# Patient Record
Sex: Female | Born: 1980
Health system: Southern US, Community
[De-identification: ages and names within clinical notes are randomized; demographics above are authoritative.]

## PROBLEM LIST (undated history)

## (undated) DIAGNOSIS — I10 Essential (primary) hypertension: Secondary | ICD-10-CM

## (undated) DIAGNOSIS — W3400XA Accidental discharge from unspecified firearms or gun, initial encounter: Secondary | ICD-10-CM

## (undated) DIAGNOSIS — L732 Hidradenitis suppurativa: Secondary | ICD-10-CM

## (undated) DIAGNOSIS — L0292 Furuncle, unspecified: Secondary | ICD-10-CM

## (undated) DIAGNOSIS — B009 Herpesviral infection, unspecified: Secondary | ICD-10-CM

## (undated) DIAGNOSIS — A64 Unspecified sexually transmitted disease: Secondary | ICD-10-CM

## (undated) HISTORY — DX: Essential (primary) hypertension: I10

## (undated) HISTORY — DX: Herpesviral infection, unspecified: B00.9

## (undated) HISTORY — DX: Furuncle, unspecified: L02.92

## (undated) HISTORY — DX: Unspecified sexually transmitted disease: A64

---
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
## 2002-03-25 ENCOUNTER — Emergency Department (HOSPITAL_COMMUNITY): Admission: EM | Admit: 2002-03-25 | Discharge: 2002-03-25 | Payer: Self-pay | Admitting: *Deleted

## 2003-07-15 ENCOUNTER — Emergency Department (HOSPITAL_COMMUNITY): Admission: EM | Admit: 2003-07-15 | Discharge: 2003-07-15 | Payer: Self-pay | Admitting: Emergency Medicine

## 2004-08-14 ENCOUNTER — Emergency Department (HOSPITAL_COMMUNITY): Admission: EM | Admit: 2004-08-14 | Discharge: 2004-08-14 | Payer: Self-pay | Admitting: Emergency Medicine

## 2004-12-05 ENCOUNTER — Emergency Department (HOSPITAL_COMMUNITY): Admission: EM | Admit: 2004-12-05 | Discharge: 2004-12-05 | Payer: Self-pay | Admitting: Emergency Medicine

## 2006-01-09 ENCOUNTER — Emergency Department (HOSPITAL_COMMUNITY): Admission: EM | Admit: 2006-01-09 | Discharge: 2006-01-09 | Payer: Self-pay | Admitting: Emergency Medicine

## 2007-02-09 ENCOUNTER — Emergency Department (HOSPITAL_COMMUNITY): Admission: EM | Admit: 2007-02-09 | Discharge: 2007-02-09 | Payer: Self-pay | Admitting: Emergency Medicine

## 2009-06-21 ENCOUNTER — Emergency Department (HOSPITAL_COMMUNITY): Admission: EM | Admit: 2009-06-21 | Discharge: 2009-06-21 | Payer: Self-pay | Admitting: Emergency Medicine

## 2010-01-03 ENCOUNTER — Emergency Department (HOSPITAL_COMMUNITY): Admission: EM | Admit: 2010-01-03 | Discharge: 2010-01-03 | Payer: Self-pay | Admitting: Emergency Medicine

## 2010-02-26 ENCOUNTER — Emergency Department (HOSPITAL_COMMUNITY): Admission: EM | Admit: 2010-02-26 | Discharge: 2010-02-26 | Payer: Self-pay | Admitting: Emergency Medicine

## 2010-05-22 ENCOUNTER — Emergency Department (HOSPITAL_COMMUNITY): Admission: EM | Admit: 2010-05-22 | Discharge: 2010-05-22 | Payer: Self-pay | Admitting: Emergency Medicine

## 2010-09-19 ENCOUNTER — Emergency Department (HOSPITAL_COMMUNITY)
Admission: EM | Admit: 2010-09-19 | Discharge: 2010-09-19 | Discharge status: Against Medical Advice | Payer: Self-pay | Attending: Emergency Medicine | Admitting: Emergency Medicine

## 2010-09-19 DIAGNOSIS — N898 Other specified noninflammatory disorders of vagina: Secondary | ICD-10-CM | POA: Insufficient documentation

## 2010-09-19 LAB — POCT I-STAT, CHEM 8
BUN: 5 mg/dL — ABNORMAL LOW (ref 6–23)
Calcium, Ion: 1.19 mmol/L (ref 1.12–1.32)
Chloride: 106 meq/L (ref 96–112)
Creatinine, Ser: 0.9 mg/dL (ref 0.4–1.2)
Glucose, Bld: 74 mg/dL (ref 70–99)
HCT: 44 % (ref 36.0–46.0)
Hemoglobin: 15 g/dL (ref 12.0–15.0)
Potassium: 4.3 meq/L (ref 3.5–5.1)
Sodium: 140 meq/L (ref 135–145)
TCO2: 23 mmol/L (ref 0–100)

## 2010-09-19 LAB — URINE MICROSCOPIC-ADD ON

## 2010-09-19 LAB — URINALYSIS, ROUTINE W REFLEX MICROSCOPIC
Bilirubin Urine: NEGATIVE
Ketones, ur: NEGATIVE mg/dL
Nitrite: NEGATIVE
Protein, ur: NEGATIVE mg/dL
Specific Gravity, Urine: 1.027 (ref 1.005–1.030)
Urine Glucose, Fasting: NEGATIVE mg/dL
Urobilinogen, UA: 1 mg/dL (ref 0.0–1.0)
pH: 6 (ref 5.0–8.0)

## 2010-09-19 LAB — POCT PREGNANCY, URINE: Preg Test, Ur: NEGATIVE

## 2010-10-16 LAB — URINALYSIS, ROUTINE W REFLEX MICROSCOPIC
Bilirubin Urine: NEGATIVE
Glucose, UA: NEGATIVE mg/dL
Hgb urine dipstick: NEGATIVE
Ketones, ur: NEGATIVE mg/dL
Nitrite: NEGATIVE
Protein, ur: NEGATIVE mg/dL
Specific Gravity, Urine: 1.024 (ref 1.005–1.030)
Urobilinogen, UA: 0.2 mg/dL (ref 0.0–1.0)
pH: 6 (ref 5.0–8.0)

## 2010-10-16 LAB — GC/CHLAMYDIA PROBE AMP, GENITAL
Chlamydia, DNA Probe: NEGATIVE
GC Probe Amp, Genital: NEGATIVE

## 2010-10-16 LAB — POCT PREGNANCY, URINE: Preg Test, Ur: NEGATIVE

## 2010-10-16 LAB — WET PREP, GENITAL
Trich, Wet Prep: NONE SEEN
Yeast Wet Prep HPF POC: NONE SEEN

## 2010-10-21 LAB — BASIC METABOLIC PANEL
BUN: 10 mg/dL (ref 6–23)
Chloride: 107 mEq/L (ref 96–112)
Glucose, Bld: 83 mg/dL (ref 70–99)
Potassium: 3.9 mEq/L (ref 3.5–5.1)

## 2010-10-21 LAB — URINALYSIS, ROUTINE W REFLEX MICROSCOPIC
Glucose, UA: NEGATIVE mg/dL
Hgb urine dipstick: NEGATIVE
Specific Gravity, Urine: 1.028 (ref 1.005–1.030)
pH: 6 (ref 5.0–8.0)

## 2010-10-21 LAB — DIFFERENTIAL
Eosinophils Absolute: 0.2 10*3/uL (ref 0.0–0.7)
Eosinophils Relative: 2 % (ref 0–5)
Lymphs Abs: 2.3 10*3/uL (ref 0.7–4.0)
Monocytes Absolute: 0.4 10*3/uL (ref 0.1–1.0)
Monocytes Relative: 6 % (ref 3–12)

## 2010-10-21 LAB — WET PREP, GENITAL
Trich, Wet Prep: NONE SEEN
Yeast Wet Prep HPF POC: NONE SEEN

## 2010-10-21 LAB — GC/CHLAMYDIA PROBE AMP, GENITAL: GC Probe Amp, Genital: NEGATIVE

## 2010-10-21 LAB — POCT PREGNANCY, URINE: Preg Test, Ur: NEGATIVE

## 2010-10-21 LAB — CBC
HCT: 40.5 % (ref 36.0–46.0)
MCV: 87.2 fL (ref 78.0–100.0)
Platelets: 179 10*3/uL (ref 150–400)
WBC: 7.4 10*3/uL (ref 4.0–10.5)

## 2010-11-06 LAB — URINE CULTURE: Colony Count: 25000

## 2010-11-06 LAB — URINALYSIS, ROUTINE W REFLEX MICROSCOPIC
Bilirubin Urine: NEGATIVE
Hgb urine dipstick: NEGATIVE
Ketones, ur: NEGATIVE mg/dL
Nitrite: NEGATIVE
Urobilinogen, UA: 1 mg/dL (ref 0.0–1.0)

## 2010-11-06 LAB — WET PREP, GENITAL
Trich, Wet Prep: NONE SEEN
Yeast Wet Prep HPF POC: NONE SEEN

## 2010-11-06 LAB — URINE MICROSCOPIC-ADD ON

## 2010-11-06 LAB — GC/CHLAMYDIA PROBE AMP, GENITAL
Chlamydia, DNA Probe: NEGATIVE
GC Probe Amp, Genital: NEGATIVE

## 2010-11-06 LAB — POCT PREGNANCY, URINE: Preg Test, Ur: NEGATIVE

## 2011-12-26 ENCOUNTER — Emergency Department (HOSPITAL_COMMUNITY)
Admission: EM | Admit: 2011-12-26 | Discharge: 2011-12-26 | Disposition: A | Discharge status: Home / Self Care | Payer: Self-pay | Attending: Emergency Medicine | Admitting: Emergency Medicine

## 2011-12-26 ENCOUNTER — Encounter (HOSPITAL_COMMUNITY): Payer: Self-pay | Admitting: *Deleted

## 2011-12-26 DIAGNOSIS — K029 Dental caries, unspecified: Secondary | ICD-10-CM | POA: Insufficient documentation

## 2011-12-26 DIAGNOSIS — K0889 Other specified disorders of teeth and supporting structures: Secondary | ICD-10-CM

## 2011-12-26 MED ORDER — HYDROCODONE-ACETAMINOPHEN 5-325 MG PO TABS: 1.0000 | ORAL_TABLET | Freq: Four times a day (QID) | ORAL | Status: AC | PRN

## 2011-12-26 MED ORDER — PENICILLIN V POTASSIUM 500 MG PO TABS: 500.0000 mg | ORAL_TABLET | Freq: Four times a day (QID) | ORAL | Status: AC

## 2011-12-26 MED ORDER — IBUPROFEN 800 MG PO TABS: 800.0000 mg | ORAL_TABLET | Freq: Three times a day (TID) | ORAL | Status: AC | PRN

## 2011-12-26 NOTE — ED Notes (Signed)
Pt c/o right lower back tooth pain x 1 week.

## 2011-12-26 NOTE — Discharge Instructions (Signed)
Return here as needed.  Followup with the oral surgeon provided.  Rinse with warm water and peroxide mixed, 3 times a day

## 2011-12-26 NOTE — ED Provider Notes (Signed)
History     CSN: [id number]  Arrival date & time 12/26/11  1342   First MD Initiated Contact with Patient 12/26/11 1356      Chief Complaint  Patient presents with  . Dental Pain    (Consider location/radiation/quality/duration/timing/severity/associated sxs/prior treatment) HPI She presents emergency Dept. with dental pain over wisdom tooth on the right lower.  He states the tooth cracked couple days ago.  Patient denies difficulty breathing, swallowing, fever, or neck swelling.  Patient states she cut ibuprofen for her pain and did not seem to help.      No past medical history on file.  No past surgical history on file.  No family history on file.  History  Substance Use Topics  . Smoking status: Not on file  . Smokeless tobacco: Not on file  . Alcohol Use: Not on file    OB History    No data available      Review of Systems All other systems negative except as documented in the HPI. All pertinent positives and negatives as reviewed in the HPI.  Allergies  Review of patient's allergies indicates no known allergies.  Home Medications   Current Outpatient Rx  Name Route Sig Dispense Refill  . BC HEADACHE PO Oral Take 1 packet by mouth every 6 (six) hours as needed. For pain    . IBUPROFEN 200 MG PO TABS Oral Take 400 mg by mouth every 6 (six) hours as needed. For pain      There were no vitals taken for this visit.  Physical Exam  Constitutional: She appears well-developed and well-nourished. No distress.  HENT:  Head: No trismus in the jaw.  Mouth/Throat: Oropharynx is clear and moist and mucous membranes are normal. Dental caries present. No uvula swelling.      ED Course  Procedures (including critical care time)  Patient referred to oral surgery and given antibiotics and pain control told to rinse with warm water and peroxide 3 times a day.  Told to return here for any worsening in her condition.  Patient has no signs of Ludwig angina or  swelling of the throat.  MDM          Carlyle Dolly, PA-C 12/26/11 1400

## 2011-12-26 NOTE — ED Provider Notes (Signed)
Medical screening examination/treatment/procedure(s) were performed by non-physician practitioner and as supervising physician I was immediately available for consultation/collaboration.   Lyanne Co, MD 12/26/11 [phone]

## 2012-08-04 DIAGNOSIS — W3400XA Accidental discharge from unspecified firearms or gun, initial encounter: Secondary | ICD-10-CM

## 2012-08-04 HISTORY — DX: Accidental discharge from unspecified firearms or gun, initial encounter: W34.00XA

## 2013-07-27 ENCOUNTER — Emergency Department (HOSPITAL_COMMUNITY): Payer: No Typology Code available for payment source

## 2013-07-27 ENCOUNTER — Inpatient Hospital Stay (HOSPITAL_COMMUNITY)
Admission: EM | Admit: 2013-07-27 | Discharge: 2013-08-05 | DRG: 329 | Disposition: A | Discharge status: Home Health | Payer: No Typology Code available for payment source | Attending: Surgery | Admitting: Surgery

## 2013-07-27 ENCOUNTER — Encounter (HOSPITAL_COMMUNITY): Payer: No Typology Code available for payment source | Admitting: Anesthesiology

## 2013-07-27 ENCOUNTER — Emergency Department (HOSPITAL_COMMUNITY): Payer: No Typology Code available for payment source | Admitting: Anesthesiology

## 2013-07-27 DIAGNOSIS — K661 Hemoperitoneum: Secondary | ICD-10-CM

## 2013-07-27 DIAGNOSIS — S36409A Unspecified injury of unspecified part of small intestine, initial encounter: Secondary | ICD-10-CM

## 2013-07-27 DIAGNOSIS — D62 Acute posthemorrhagic anemia: Secondary | ICD-10-CM | POA: Diagnosis not present

## 2013-07-27 DIAGNOSIS — W3400XA Accidental discharge from unspecified firearms or gun, initial encounter: Secondary | ICD-10-CM

## 2013-07-27 DIAGNOSIS — S36509A Unspecified injury of unspecified part of colon, initial encounter: Secondary | ICD-10-CM

## 2013-07-27 DIAGNOSIS — I10 Essential (primary) hypertension: Secondary | ICD-10-CM | POA: Diagnosis present

## 2013-07-27 DIAGNOSIS — E876 Hypokalemia: Secondary | ICD-10-CM | POA: Diagnosis present

## 2013-07-27 DIAGNOSIS — S36503A Unspecified injury of sigmoid colon, initial encounter: Secondary | ICD-10-CM | POA: Diagnosis present

## 2013-07-27 DIAGNOSIS — S36499A Other injury of unspecified part of small intestine, initial encounter: Principal | ICD-10-CM | POA: Diagnosis present

## 2013-07-27 DIAGNOSIS — S51812A Laceration without foreign body of left forearm, initial encounter: Secondary | ICD-10-CM

## 2013-07-27 DIAGNOSIS — R578 Other shock: Secondary | ICD-10-CM

## 2013-07-27 DIAGNOSIS — E872 Acidosis, unspecified: Secondary | ICD-10-CM | POA: Diagnosis present

## 2013-07-27 DIAGNOSIS — S51809A Unspecified open wound of unspecified forearm, initial encounter: Secondary | ICD-10-CM | POA: Diagnosis present

## 2013-07-27 DIAGNOSIS — S41139A Puncture wound without foreign body of unspecified upper arm, initial encounter: Secondary | ICD-10-CM

## 2013-07-27 LAB — COMPREHENSIVE METABOLIC PANEL
ALT: 12 U/L (ref 0–35)
Albumin: 3.8 g/dL (ref 3.5–5.2)
Alkaline Phosphatase: 48 U/L (ref 39–117)
BUN: 11 mg/dL (ref 6–23)
Chloride: 103 mEq/L (ref 96–112)
GFR calc Af Amer: 88 mL/min — ABNORMAL LOW (ref >90)
Glucose, Bld: 108 mg/dL — ABNORMAL HIGH (ref 70–99)
Potassium: 2.8 mEq/L — ABNORMAL LOW (ref 3.5–5.1)
Sodium: 138 mEq/L (ref 135–145)
Total Bilirubin: 0.4 mg/dL (ref 0.3–1.2)
Total Protein: 7.9 g/dL (ref 6.0–8.3)

## 2013-07-27 LAB — CBC
HCT: 37.9 % (ref 36.0–46.0)
MCHC: 34.8 g/dL (ref 30.0–36.0)
MCV: 82.4 fL (ref 78.0–100.0)
Platelets: 220 10*3/uL (ref 150–400)
RBC: 4.6 MIL/uL (ref 3.87–5.11)
RDW: 13.2 % (ref 11.5–15.5)

## 2013-07-27 MED ORDER — SUCCINYLCHOLINE CHLORIDE 20 MG/ML IJ SOLN
INTRAMUSCULAR | Status: AC | PRN
  Administered 2013-07-27: 120 mg via INTRAVENOUS

## 2013-07-27 MED ORDER — FENTANYL CITRATE 0.05 MG/ML IJ SOLN
INTRAMUSCULAR | Status: AC | PRN
  Administered 2013-07-27: 50 ug via INTRAVENOUS

## 2013-07-27 MED ORDER — SODIUM CHLORIDE 0.9 % IV SOLN
3.0000 g | Freq: Once | INTRAVENOUS | Status: AC
  Administered 2013-07-27: 3 g via INTRAVENOUS

## 2013-07-27 MED ORDER — ETOMIDATE 2 MG/ML IV SOLN
INTRAVENOUS | Status: AC | PRN
  Administered 2013-07-27: 20 mg via INTRAVENOUS

## 2013-07-27 MED ORDER — MIDAZOLAM HCL 5 MG/5ML IJ SOLN
INTRAMUSCULAR | Status: AC | PRN
  Administered 2013-07-27: 4 mg via INTRAVENOUS

## 2013-07-27 MED ORDER — TETANUS-DIPHTH-ACELL PERTUSSIS 5-2.5-18.5 LF-MCG/0.5 IM SUSP
0.5000 mL | Freq: Once | INTRAMUSCULAR | Status: AC
  Administered 2013-07-27: 0.5 mL via INTRAMUSCULAR

## 2013-07-27 NOTE — ED Notes (Signed)
Pt belongiongs bagged in paper bags and taped shut. One pair of shoes and one cut off shirt.
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
## 2013-07-27 NOTE — Anesthesia Preprocedure Evaluation (Addendum)
Anesthesia Evaluation  Patient identified by MRN, date of birth, ID band Patient unresponsive    Airway      Comment: Intubated Dental   Pulmonary    Pulmonary exam normal       Cardiovascular     Neuro/Psych    GI/Hepatic   Endo/Other    Renal/GU      Musculoskeletal   Abdominal   Peds  Hematology   Anesthesia Other Findings   Reproductive/Obstetrics                           Anesthesia Physical Anesthesia Plan  ASA: IV and emergent  Anesthesia Plan: General   Post-op Pain Management:    Induction:   Airway Management Planned:   Additional Equipment:   Intra-op Plan:   Post-operative Plan: Post-operative intubation/ventilation  Informed Consent:   Only emergency history available  Plan Discussed with: CRNA, Anesthesiologist and Surgeon  Anesthesia Plan Comments:      Anesthesia Quick Evaluation

## 2013-07-28 ENCOUNTER — Encounter (HOSPITAL_COMMUNITY): Admission: EM | Disposition: A | Discharge status: Home Health | Payer: Self-pay | Source: Home / Self Care

## 2013-07-28 ENCOUNTER — Encounter (HOSPITAL_COMMUNITY): Payer: Self-pay | Admitting: Certified Registered Nurse Anesthetist

## 2013-07-28 DIAGNOSIS — I10 Essential (primary) hypertension: Secondary | ICD-10-CM

## 2013-07-28 DIAGNOSIS — D62 Acute posthemorrhagic anemia: Secondary | ICD-10-CM

## 2013-07-28 DIAGNOSIS — E876 Hypokalemia: Secondary | ICD-10-CM

## 2013-07-28 DIAGNOSIS — S51809A Unspecified open wound of unspecified forearm, initial encounter: Secondary | ICD-10-CM

## 2013-07-28 DIAGNOSIS — S31109A Unspecified open wound of abdominal wall, unspecified quadrant without penetration into peritoneal cavity, initial encounter: Secondary | ICD-10-CM

## 2013-07-28 DIAGNOSIS — J95821 Acute postprocedural respiratory failure: Secondary | ICD-10-CM

## 2013-07-28 DIAGNOSIS — IMO0002 Reserved for concepts with insufficient information to code with codable children: Secondary | ICD-10-CM

## 2013-07-28 DIAGNOSIS — S36509A Unspecified injury of unspecified part of colon, initial encounter: Secondary | ICD-10-CM

## 2013-07-28 HISTORY — PX: LAPAROTOMY: SHX154

## 2013-07-28 HISTORY — PX: COLOSTOMY REVISION: SHX5232

## 2013-07-28 HISTORY — PX: BOWEL RESECTION: SHX1257

## 2013-07-28 HISTORY — PX: INCISION AND DRAINAGE OF WOUND: SHX1803

## 2013-07-28 LAB — COMPREHENSIVE METABOLIC PANEL
Albumin: 3.6 g/dL (ref 3.5–5.2)
Alkaline Phosphatase: 31 U/L — ABNORMAL LOW (ref 39–117)
BUN: 9 mg/dL (ref 6–23)
CO2: 19 mEq/L (ref 19–32)
Chloride: 108 mEq/L (ref 96–112)
Creatinine, Ser: 0.71 mg/dL (ref 0.50–1.10)
GFR calc Af Amer: >90 mL/min (ref >90)
Glucose, Bld: 117 mg/dL — ABNORMAL HIGH (ref 70–99)
Potassium: 4 mEq/L (ref 3.5–5.1)
Total Bilirubin: 0.6 mg/dL (ref 0.3–1.2)
Total Protein: 6.1 g/dL (ref 6.0–8.3)

## 2013-07-28 LAB — CBC
HCT: 33.8 % — ABNORMAL LOW (ref 36.0–46.0)
Hemoglobin: 11.8 g/dL — ABNORMAL LOW (ref 12.0–15.0)
MCHC: 34.9 g/dL (ref 30.0–36.0)
MCV: 82 fL (ref 78.0–100.0)
RBC: 4.12 MIL/uL (ref 3.87–5.11)
RDW: 13.4 % (ref 11.5–15.5)

## 2013-07-28 LAB — POCT I-STAT 3, ART BLOOD GAS (G3+)
Acid-base deficit: 7 mmol/L — ABNORMAL HIGH (ref 0.0–2.0)
Acid-base deficit: 4 mmol/L — ABNORMAL HIGH (ref 0.0–2.0)
Bicarbonate: 20.6 meq/L (ref 20.0–24.0)
Bicarbonate: 22 meq/L (ref 20.0–24.0)
Patient temperature: 98.2
Patient temperature: 98.4
TCO2: 22 mmol/L (ref 0–100)
TCO2: 23 mmol/L (ref 0–100)
pCO2 arterial: 49.3 mmHg — ABNORMAL HIGH (ref 35.0–45.0)
pH, Arterial: 7.229 — ABNORMAL LOW (ref 7.350–7.450)
pH, Arterial: 7.321 — ABNORMAL LOW (ref 7.350–7.450)
pO2, Arterial: 153 mmHg — ABNORMAL HIGH (ref 80.0–100.0)
pO2, Arterial: 183 mmHg — ABNORMAL HIGH (ref 80.0–100.0)

## 2013-07-28 LAB — POCT I-STAT 7, (LYTES, BLD GAS, ICA,H+H)
Acid-base deficit: 9 mmol/L — ABNORMAL HIGH (ref 0.0–2.0)
Bicarbonate: 16.6 mEq/L — ABNORMAL LOW (ref 20.0–24.0)
HCT: 39 % (ref 36.0–46.0)
Hemoglobin: 13.3 g/dL (ref 12.0–15.0)
Patient temperature: 36.2
Potassium: 3.6 meq/L (ref 3.5–5.1)
Sodium: 141 meq/L (ref 135–145)
pCO2 arterial: 32.3 mmHg — ABNORMAL LOW (ref 35.0–45.0)
pH, Arterial: 7.316 — ABNORMAL LOW (ref 7.350–7.450)

## 2013-07-28 LAB — CDS SEROLOGY

## 2013-07-28 SURGERY — LAPAROTOMY, EXPLORATORY
Anesthesia: General | Site: Wrist

## 2013-07-28 MED ORDER — PROPOFOL 10 MG/ML IV EMUL
INTRAVENOUS | Status: AC
  Administered 2013-07-28: 03:00:00
  Filled 2013-07-28: qty 100

## 2013-07-28 MED ORDER — FENTANYL CITRATE 0.05 MG/ML IJ SOLN
50.0000 ug/h | INTRAMUSCULAR | Status: DC
  Administered 2013-07-28: 100 ug/h via INTRAVENOUS
  Filled 2013-07-28: qty 50

## 2013-07-28 MED ORDER — ROCURONIUM BROMIDE 100 MG/10ML IV SOLN
INTRAVENOUS | Status: DC | PRN
  Administered 2013-07-27 – 2013-07-28 (×2): 50 mg via INTRAVENOUS

## 2013-07-28 MED ORDER — PANTOPRAZOLE SODIUM 40 MG IV SOLR
40.0000 mg | Freq: Every day | INTRAVENOUS | Status: DC
  Administered 2013-07-28 – 2013-08-02 (×6): 40 mg via INTRAVENOUS
  Filled 2013-07-28 (×7): qty 40

## 2013-07-28 MED ORDER — SODIUM CHLORIDE 0.9 % IV SOLN
INTRAVENOUS | Status: DC | PRN
  Administered 2013-07-27: via INTRAVENOUS

## 2013-07-28 MED ORDER — MORPHINE SULFATE (PF) 1 MG/ML IV SOLN
INTRAVENOUS | Status: DC
  Administered 2013-07-28: 7.5 mg via INTRAVENOUS
  Administered 2013-07-28 (×2): via INTRAVENOUS
  Administered 2013-07-28: 6 mg via INTRAVENOUS
  Administered 2013-07-29: 0.9 mg via INTRAVENOUS
  Administered 2013-07-29: 10.5 mg via INTRAVENOUS
  Administered 2013-07-29: 10.02 mg via INTRAVENOUS
  Administered 2013-07-29: 15:00:00 via INTRAVENOUS
  Administered 2013-07-29: 11.19 mg via INTRAVENOUS
  Administered 2013-07-29: 8.91 mg via INTRAVENOUS
  Administered 2013-07-29: 10.5 mg via INTRAVENOUS
  Administered 2013-07-29: 06:00:00 via INTRAVENOUS
  Administered 2013-07-30: 10.5 mg via INTRAVENOUS
  Administered 2013-07-30 (×2): 4.5 mg via INTRAVENOUS
  Administered 2013-07-30: 31.5 mg via INTRAVENOUS
  Administered 2013-07-30 (×2): via INTRAVENOUS
  Administered 2013-07-31: 12 mg via INTRAVENOUS
  Administered 2013-07-31: 12:00:00 via INTRAVENOUS
  Administered 2013-07-31: 9 mg via INTRAVENOUS
  Administered 2013-07-31: 4.5 mg via INTRAVENOUS
  Administered 2013-07-31: 5.66 mg via INTRAVENOUS
  Administered 2013-07-31: 7 mg via INTRAVENOUS
  Administered 2013-07-31: 7.5 mg via INTRAVENOUS
  Administered 2013-07-31: 9.5 mg via INTRAVENOUS
  Administered 2013-08-01: 8.42 mg via INTRAVENOUS
  Administered 2013-08-01: 7.5 mg via INTRAVENOUS
  Administered 2013-08-01: 13:00:00 via INTRAVENOUS
  Administered 2013-08-01: 7.5 mg via INTRAVENOUS
  Administered 2013-08-01: 21:00:00 via INTRAVENOUS
  Administered 2013-08-01: 9 mg via INTRAVENOUS
  Administered 2013-08-01: 23.57 mg via INTRAVENOUS
  Administered 2013-08-02: 10.5 mg via INTRAVENOUS
  Administered 2013-08-02: 7.5 mg via INTRAVENOUS
  Administered 2013-08-02: 8.07 mg via INTRAVENOUS
  Administered 2013-08-02: 3 mg via INTRAVENOUS
  Administered 2013-08-02: 20:00:00 via INTRAVENOUS
  Administered 2013-08-02: 7.5 mg via INTRAVENOUS
  Administered 2013-08-02: 11:00:00 via INTRAVENOUS
  Administered 2013-08-03: 6 mg via INTRAVENOUS
  Filled 2013-07-28 (×13): qty 25

## 2013-07-28 MED ORDER — HYDROMORPHONE HCL PF 1 MG/ML IJ SOLN
1.0000 mg | INTRAMUSCULAR | Status: DC | PRN
  Administered 2013-07-28 (×2): 1 mg via INTRAVENOUS
  Filled 2013-07-28: qty 1

## 2013-07-28 MED ORDER — CHLORHEXIDINE GLUCONATE 0.12 % MT SOLN
15.0000 mL | Freq: Two times a day (BID) | OROMUCOSAL | Status: DC
  Administered 2013-07-28 – 2013-08-04 (×14): 15 mL via OROMUCOSAL
  Filled 2013-07-28 (×13): qty 15

## 2013-07-28 MED ORDER — DIPHENHYDRAMINE HCL 12.5 MG/5ML PO ELIX
12.5000 mg | ORAL_SOLUTION | Freq: Four times a day (QID) | ORAL | Status: DC | PRN
  Filled 2013-07-28: qty 5

## 2013-07-28 MED ORDER — HYDROMORPHONE HCL PF 1 MG/ML IJ SOLN: 1.0000 mg | INTRAMUSCULAR | Status: DC | PRN

## 2013-07-28 MED ORDER — DIPHENHYDRAMINE HCL 50 MG/ML IJ SOLN
12.5000 mg | Freq: Four times a day (QID) | INTRAMUSCULAR | Status: DC | PRN
  Administered 2013-07-31 – 2013-08-02 (×4): 12.5 mg via INTRAVENOUS
  Filled 2013-07-28 (×4): qty 1

## 2013-07-28 MED ORDER — HYDRALAZINE HCL 20 MG/ML IJ SOLN
20.0000 mg | INTRAMUSCULAR | Status: DC | PRN
  Administered 2013-07-28 (×2): 20 mg via INTRAVENOUS
  Filled 2013-07-28 (×2): qty 1

## 2013-07-28 MED ORDER — DEXTROSE IN LACTATED RINGERS 5 % IV SOLN
INTRAVENOUS | Status: DC
  Administered 2013-07-28: 20:00:00 via INTRAVENOUS
  Administered 2013-07-28 – 2013-07-29 (×3): 125 mL/h via INTRAVENOUS
  Administered 2013-07-29 – 2013-07-31 (×6): via INTRAVENOUS
  Administered 2013-07-31: 125 mL/h via INTRAVENOUS
  Administered 2013-08-01 – 2013-08-03 (×6): via INTRAVENOUS

## 2013-07-28 MED ORDER — METOPROLOL TARTRATE 1 MG/ML IV SOLN
5.0000 mg | Freq: Four times a day (QID) | INTRAVENOUS | Status: DC
  Administered 2013-07-28 – 2013-07-29 (×4): 5 mg via INTRAVENOUS
  Filled 2013-07-28 (×8): qty 5

## 2013-07-28 MED ORDER — ONDANSETRON HCL 4 MG PO TABS: 4.0000 mg | ORAL_TABLET | Freq: Four times a day (QID) | ORAL | Status: DC | PRN

## 2013-07-28 MED ORDER — ENOXAPARIN SODIUM 40 MG/0.4ML ~~LOC~~ SOLN
40.0000 mg | SUBCUTANEOUS | Status: DC
  Administered 2013-07-28 – 2013-08-04 (×8): 40 mg via SUBCUTANEOUS
  Filled 2013-07-28 (×9): qty 0.4

## 2013-07-28 MED ORDER — HYDROMORPHONE HCL PF 1 MG/ML IJ SOLN
INTRAMUSCULAR | Status: AC
  Filled 2013-07-28: qty 1

## 2013-07-28 MED ORDER — 0.9 % SODIUM CHLORIDE (POUR BTL) OPTIME
TOPICAL | Status: DC | PRN
  Administered 2013-07-27 (×2): 1000 mL

## 2013-07-28 MED ORDER — PROPOFOL 10 MG/ML IV EMUL
5.0000 ug/kg/min | INTRAVENOUS | Status: DC
  Administered 2013-07-28: 70 ug/kg/min via INTRAVENOUS

## 2013-07-28 MED ORDER — SODIUM CHLORIDE 0.9 % IJ SOLN: 9.0000 mL | INTRAMUSCULAR | Status: DC | PRN

## 2013-07-28 MED ORDER — SODIUM CHLORIDE 0.9 % IV SOLN
3.0000 g | Freq: Three times a day (TID) | INTRAVENOUS | Status: DC
  Administered 2013-07-28 – 2013-07-30 (×7): 3 g via INTRAVENOUS
  Filled 2013-07-28 (×11): qty 3

## 2013-07-28 MED ORDER — ONDANSETRON HCL 4 MG/2ML IJ SOLN
4.0000 mg | Freq: Four times a day (QID) | INTRAMUSCULAR | Status: DC | PRN
  Administered 2013-08-01: 4 mg via INTRAVENOUS

## 2013-07-28 MED ORDER — 0.9 % SODIUM CHLORIDE (POUR BTL) OPTIME
TOPICAL | Status: DC | PRN
  Administered 2013-07-28 (×2): 1000 mL

## 2013-07-28 MED ORDER — PANTOPRAZOLE SODIUM 40 MG PO TBEC: 40.0000 mg | DELAYED_RELEASE_TABLET | Freq: Every day | ORAL | Status: DC

## 2013-07-28 MED ORDER — NALOXONE HCL 0.4 MG/ML IJ SOLN: 0.4000 mg | INTRAMUSCULAR | Status: DC | PRN

## 2013-07-28 MED ORDER — SODIUM CHLORIDE 0.9 % IV SOLN: INTRAVENOUS | Status: DC | PRN

## 2013-07-28 MED ORDER — ALBUMIN HUMAN 5 % IV SOLN
INTRAVENOUS | Status: DC | PRN
  Administered 2013-07-28 (×2): via INTRAVENOUS

## 2013-07-28 MED ORDER — FENTANYL CITRATE 0.05 MG/ML IJ SOLN
INTRAMUSCULAR | Status: DC | PRN
  Administered 2013-07-27: 150 ug via INTRAVENOUS
  Administered 2013-07-27: 100 ug via INTRAVENOUS
  Administered 2013-07-28 (×2): 150 ug via INTRAVENOUS
  Administered 2013-07-28: 100 ug via INTRAVENOUS
  Administered 2013-07-28: 150 ug via INTRAVENOUS
  Administered 2013-07-28 (×2): 100 ug via INTRAVENOUS

## 2013-07-28 MED ORDER — ONDANSETRON HCL 4 MG/2ML IJ SOLN
4.0000 mg | Freq: Four times a day (QID) | INTRAMUSCULAR | Status: DC | PRN
  Administered 2013-07-28 – 2013-08-04 (×4): 4 mg via INTRAVENOUS
  Filled 2013-07-28 (×5): qty 2

## 2013-07-28 MED ORDER — BIOTENE DRY MOUTH MT LIQD
15.0000 mL | Freq: Four times a day (QID) | OROMUCOSAL | Status: DC
  Administered 2013-07-28 – 2013-08-04 (×21): 15 mL via OROMUCOSAL

## 2013-07-28 MED ORDER — PROPOFOL INFUSION 10 MG/ML OPTIME
INTRAVENOUS | Status: DC | PRN
  Administered 2013-07-28: 50 ug/kg/min via INTRAVENOUS

## 2013-07-28 MED ORDER — LACTATED RINGERS IV SOLN
INTRAVENOUS | Status: DC | PRN
  Administered 2013-07-28 (×2): via INTRAVENOUS

## 2013-07-28 MED ORDER — PHENYLEPHRINE HCL 10 MG/ML IJ SOLN
INTRAMUSCULAR | Status: DC | PRN
  Administered 2013-07-28: 80 ug via INTRAVENOUS

## 2013-07-28 MED ORDER — SODIUM CHLORIDE 0.9 % IV SOLN
1.0000 mg/h | INTRAVENOUS | Status: DC
  Administered 2013-07-28: 4 mg/h via INTRAVENOUS
  Filled 2013-07-28 (×2): qty 10

## 2013-07-28 SURGICAL SUPPLY — 57 items
BLADE SURG ROTATE 9660 (MISCELLANEOUS) IMPLANT
BNDG GAUZE ELAST 4 BULKY (GAUZE/BANDAGES/DRESSINGS) ×3 IMPLANT
CANISTER SUCTION 2500CC (MISCELLANEOUS) IMPLANT
CHLORAPREP W/TINT 26ML (MISCELLANEOUS) IMPLANT
COVER MAYO STAND STRL (DRAPES) IMPLANT
COVER SURGICAL LIGHT HANDLE (MISCELLANEOUS) ×3 IMPLANT
DRAPE LAPAROSCOPIC ABDOMINAL (DRAPES) ×3 IMPLANT
DRAPE PROXIMA HALF (DRAPES) IMPLANT
DRAPE UTILITY 15X26 W/TAPE STR (DRAPE) ×6 IMPLANT
DRAPE WARM FLUID 44X44 (DRAPE) ×3 IMPLANT
DRSG OPSITE POSTOP 4X10 (GAUZE/BANDAGES/DRESSINGS) IMPLANT
DRSG OPSITE POSTOP 4X8 (GAUZE/BANDAGES/DRESSINGS) IMPLANT
DRSG VAC ATS MED SENSATRAC (GAUZE/BANDAGES/DRESSINGS) ×3 IMPLANT
ELECT BLADE 6.5 EXT (BLADE) ×3 IMPLANT
ELECT CAUTERY BLADE 6.4 (BLADE) ×6 IMPLANT
ELECT REM PT RETURN 9FT ADLT (ELECTROSURGICAL) ×3
ELECTRODE REM PT RTRN 9FT ADLT (ELECTROSURGICAL) ×2 IMPLANT
GLOVE BIO SURGEON STRL SZ8 (GLOVE) ×6 IMPLANT
GLOVE BIOGEL PI IND STRL 7.5 (GLOVE) ×2 IMPLANT
GLOVE BIOGEL PI IND STRL 8 (GLOVE) ×2 IMPLANT
GLOVE BIOGEL PI INDICATOR 7.5 (GLOVE) ×1
GLOVE BIOGEL PI INDICATOR 8 (GLOVE) ×1
GLOVE SS BIOGEL STRL SZ 7.5 (GLOVE) ×2 IMPLANT
GLOVE SUPERSENSE BIOGEL SZ 7.5 (GLOVE) ×1
GLOVE SURG SS PI 6.5 STRL IVOR (GLOVE) ×3 IMPLANT
GLOVE SURG SS PI 7.5 STRL IVOR (GLOVE) ×6 IMPLANT
GOWN STRL NON-REIN LRG LVL3 (GOWN DISPOSABLE) IMPLANT
GOWN STRL REIN XL XLG (GOWN DISPOSABLE) ×9 IMPLANT
KIT BASIN OR (CUSTOM PROCEDURE TRAY) ×3 IMPLANT
KIT ROOM TURNOVER OR (KITS) ×3 IMPLANT
LIGASURE IMPACT 36 18CM CVD LR (INSTRUMENTS) IMPLANT
NS IRRIG 1000ML POUR BTL (IV SOLUTION) ×6 IMPLANT
PACK GENERAL/GYN (CUSTOM PROCEDURE TRAY) ×3 IMPLANT
PAD ARMBOARD 7.5X6 YLW CONV (MISCELLANEOUS) ×3 IMPLANT
PENCIL BUTTON HOLSTER BLD 10FT (ELECTRODE) ×3 IMPLANT
RELOAD PROXIMATE 75MM BLUE (ENDOMECHANICALS) ×18 IMPLANT
RELOAD PROXIMATE TA60MM GREEN (ENDOMECHANICALS) ×3 IMPLANT
RELOAD STAPLER LINE PROX 60 GR (STAPLE) ×2 IMPLANT
SPECIMEN JAR LARGE (MISCELLANEOUS) IMPLANT
SPONGE GAUZE 4X4 12PLY (GAUZE/BANDAGES/DRESSINGS) ×3 IMPLANT
SPONGE LAP 18X18 X RAY DECT (DISPOSABLE) ×9 IMPLANT
STAPLER PROXIMATE 55 BLUE (STAPLE) IMPLANT
STAPLER PROXIMATE 75MM BLUE (STAPLE) ×3 IMPLANT
STAPLER RELOAD LINE PROX 60 GR (STAPLE) ×3
STAPLER VISISTAT 35W (STAPLE) ×3 IMPLANT
SUCTION POOLE TIP (SUCTIONS) ×3 IMPLANT
SUT SILK 2 0 SH CR/8 (SUTURE) ×3 IMPLANT
SUT VIC AB 2-0 SH 18 (SUTURE) ×3 IMPLANT
SUT VIC AB 3-0 SH 18 (SUTURE) ×3 IMPLANT
SUT VICRYL AB 2 0 TIES (SUTURE) ×3 IMPLANT
SUT VICRYL AB 3 0 TIES (SUTURE) ×6 IMPLANT
TAPE CLOTH SURG 6X10 WHT LF (GAUZE/BANDAGES/DRESSINGS) ×6 IMPLANT
TOWEL OR 17X24 6PK STRL BLUE (TOWEL DISPOSABLE) ×6 IMPLANT
TOWEL OR 17X26 10 PK STRL BLUE (TOWEL DISPOSABLE) ×3 IMPLANT
TRAY FOLEY CATH 16FRSI W/METER (SET/KITS/TRAYS/PACK) ×3 IMPLANT
TUBE CONNECTING 12X1/4 (SUCTIONS) ×3 IMPLANT
YANKAUER SUCT BULB TIP NO VENT (SUCTIONS) ×3 IMPLANT
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
## 2013-07-28 NOTE — Progress Notes (Signed)
Dr Donell Beers updated pt status. MD updated aline bp reading 30-50 greater than cuff pressure. MD updated aline sbp currently 160s with cuff sbp 120. Pt ST with HR 110-120s. MD updated PRN hydralazine given x2 and scheduled lopressor ~1200. MD stated treat cuff pressure as needed with PRN medications. Keep aline in place for now, plan to possibly d/c 07/29/13 per MD. Will continue to monitor. Koren Bound

## 2013-07-28 NOTE — Progress Notes (Signed)
Notified Dr. Luisa Hart of lab and blood gas results.  No orders received.  I will continue to monitor.

## 2013-07-28 NOTE — OR Nursing (Signed)
The patient presented to the operating room for an emergency exploratory laparotomy with Harriette Bouillon, MD. Upon arrival a gray band with red stone ring on the right hand and a gray band ring on the left hand was removed. Clothing pieces were also removed. All belongings were placed in a bag and labeled with a patient sticker. Baldwin Crown, RN met with security personnel, Nathanial Rancher, to secure the belongings post-operatively.  Oralia Manis, RN
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
## 2013-07-28 NOTE — Preoperative (Signed)
Beta Blockers   Reason not to administer Beta Blockers:Not Applicable 
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
## 2013-07-28 NOTE — Procedures (Signed)
Extubation Procedure Note  Patient Details:   Name: Taila Basinski DOB: 07-May-1981 MRN: [id number]   Airway Documentation:  +air leak around cuff prior to extubation.      Evaluation  O2 sats: stable throughout Complications: No apparent complications Patient did tolerate procedure well. Bilateral Breath Sounds: Clear;Diminished;Other (Comment) (coarse) Suctioning: Airway Yes, pt able to speak.  NO stridor noted, no resp distress noted, pt denies SOB presently.    Jennette Kettle 07/28/2013, 1:38 PM

## 2013-07-28 NOTE — Anesthesia Postprocedure Evaluation (Signed)
  Anesthesia Post-op Note  Patient: Alicia Cannon  Procedure(s) Performed: Procedure(s): EXPLORATORY LAPAROTOMY (N/A) SMALL BOWEL RESECTION (N/A) COLON RESECTION SIGMOID (N/A) IRRIGATION AND DEBRIDEMENT WOUND (Left)  Patient Location: SICU  Anesthesia Type:General  Level of Consciousness: Patient remains intubated per anesthesia plan  Airway and Oxygen Therapy: Patient remains intubated per anesthesia plan and Patient placed on Ventilator (see vital sign flow sheet for setting)  Post-op Pain: intubated with sedation infusion  Post-op Assessment: Post-op Vital signs reviewed, Patient's Cardiovascular Status Stable and Respiratory Function Stable  Post-op Vital Signs: Reviewed and stable  Complications: No apparent anesthesia complications

## 2013-07-28 NOTE — Progress Notes (Signed)
Versed gtt d/c pre extubation, gtt with 8ml volume in bag with d/c, remaining volume waste in sink, Catalina Lunger RN witness. waste with d/c of fentanyl gtt, gtt taken down. Catalina Lunger RN witness waste in sink. Koren Bound

## 2013-07-28 NOTE — ED Notes (Signed)
Pt. Given 2500 in IV fluids

## 2013-07-28 NOTE — Progress Notes (Signed)
Dr Donell Beers called, updated on cpap ABG results after ~1hour on CPAP. MD ok to extubate pt, will continue NGT to suction. D/C fentanyl and versed gtts, start Morphine PCA, full strength for pt comfort. Updated fentanyl gtt infusing at 17mcg/hr majority of cpap trial, gtt off now, pt follows all commands, RASS 0/-1. Will continue to monitor. Koren Bound

## 2013-07-28 NOTE — Progress Notes (Signed)
35 ml of propofol wasted down sink.  Witness was Rob Designer, multimedia.
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
## 2013-07-28 NOTE — Transfer of Care (Signed)
Immediate Anesthesia Transfer of Care Note  Patient: Alicia Cannon  Procedure(s) Performed: Procedure(s): EXPLORATORY LAPAROTOMY (N/A) SMALL BOWEL RESECTION (N/A) COLON RESECTION SIGMOID (N/A) IRRIGATION AND DEBRIDEMENT WOUND (Left)  Patient Location: SICU  Anesthesia Type:General  Level of Consciousness: Patient remains intubated per anesthesia plan  Airway & Oxygen Therapy: Patient remains intubated per anesthesia plan and Patient placed on Ventilator (see vital sign flow sheet for setting)  Post-op Assessment: Report given to PACU RN  Post vital signs: Reviewed and stable  Complications: No apparent anesthesia complications

## 2013-07-29 ENCOUNTER — Encounter (HOSPITAL_COMMUNITY): Payer: Self-pay | Admitting: *Deleted

## 2013-07-29 DIAGNOSIS — S36409A Unspecified injury of unspecified part of small intestine, initial encounter: Secondary | ICD-10-CM

## 2013-07-29 DIAGNOSIS — S36503A Unspecified injury of sigmoid colon, initial encounter: Secondary | ICD-10-CM

## 2013-07-29 DIAGNOSIS — S51812A Laceration without foreign body of left forearm, initial encounter: Secondary | ICD-10-CM

## 2013-07-29 DIAGNOSIS — S41139A Puncture wound without foreign body of unspecified upper arm, initial encounter: Secondary | ICD-10-CM

## 2013-07-29 DIAGNOSIS — W3400XA Accidental discharge from unspecified firearms or gun, initial encounter: Secondary | ICD-10-CM

## 2013-07-29 LAB — CBC
HCT: 27.6 % — ABNORMAL LOW (ref 36.0–46.0)
MCH: 28.1 pg (ref 26.0–34.0)
MCHC: 33.7 g/dL (ref 30.0–36.0)
MCV: 83.4 fL (ref 78.0–100.0)
Platelets: 136 10*3/uL — ABNORMAL LOW (ref 150–400)
RDW: 13.3 % (ref 11.5–15.5)
WBC: 9 10*3/uL (ref 4.0–10.5)

## 2013-07-29 MED ORDER — METOPROLOL TARTRATE 1 MG/ML IV SOLN
2.5000 mg | Freq: Four times a day (QID) | INTRAVENOUS | Status: DC
  Administered 2013-07-29 – 2013-08-04 (×24): 2.5 mg via INTRAVENOUS
  Filled 2013-07-29 (×36): qty 5

## 2013-07-29 MED ORDER — WHITE PETROLATUM GEL
Status: AC
  Administered 2013-07-29: 17:00:00
  Filled 2013-07-29: qty 5

## 2013-07-29 NOTE — Progress Notes (Signed)
Pt transferred to 6N32 via bed. Pt with orders for no tele. Pt cell phone/charger, pt wig sent with pt to new room. Family notified of transfer. Pt tolerated transfer well. Receiving RN present for transfer. Pt ambulated in room to new bed, tolerated well. Morphine pca continues. Receiving RN notified that wound care nurse previously called, stated will be by within hour to change wound vac. Supplies sent to new room. Koren Bound

## 2013-07-29 NOTE — Progress Notes (Signed)
Dr Janee Morn updated at bedside. Updated pt pain managed with morphine PCA, pt reports dosing on and off most of morning. MD assessed wound vac, minimal output, MD to put in wound care consult for wound RN to change vac today. MD updated left abdominal incision with staples, oozing serosanguinous fluid, will continue to change dressing as needed to keep pressure. Scant drainage from left arm incision. Orders to transfer pt to 6N received, no tele. MD updated pt at bedside. Will d/c aline and foley today. MD ok to increase activity as pt tolerates. MD ok with ice chips, will keep ngt to suction for now. Will continue to monitor. Koren Bound

## 2013-07-29 NOTE — ED Provider Notes (Addendum)
I supervised the resident Dr. Gordy Levan during each aspect of endotracheal intubation and was at the bedside throughout the entire procedure. Agree with his note describing procedure and outcome.   Brandt Loosen, MD 07/29/13 0201  Brandt Loosen, MD 08/11/13 [phone]
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
## 2013-07-29 NOTE — Progress Notes (Signed)
Spoke with wound care RN in regards to changing wound vac. RN currently at [facility], stated will be by to see pt within hour to change vac, wound care RN updated pt with transfer orders and ready bed, 6N32. Will continue to monitor. Koren Bound

## 2013-07-29 NOTE — Progress Notes (Signed)
UR completed.  Euva Rundell, RN BSN [facility] CCM Trauma/Neuro ICU Case Manager [phone]  
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
## 2013-07-30 LAB — CBC
HCT: 26.2 % — ABNORMAL LOW (ref 36.0–46.0)
Hemoglobin: 8.8 g/dL — ABNORMAL LOW (ref 12.0–15.0)
MCHC: 33.6 g/dL (ref 30.0–36.0)
MCV: 83.7 fL (ref 78.0–100.0)
Platelets: 125 10*3/uL — ABNORMAL LOW (ref 150–400)
RDW: 12.9 % (ref 11.5–15.5)
WBC: 9.2 10*3/uL (ref 4.0–10.5)

## 2013-07-30 LAB — BASIC METABOLIC PANEL
BUN: <3 mg/dL — ABNORMAL LOW (ref 6–23)
Chloride: 103 mEq/L (ref 96–112)
Creatinine, Ser: 0.64 mg/dL (ref 0.50–1.10)
Glucose, Bld: 110 mg/dL — ABNORMAL HIGH (ref 70–99)
Potassium: 3.1 mEq/L — ABNORMAL LOW (ref 3.5–5.1)

## 2013-07-30 NOTE — Progress Notes (Signed)
2 Days Post-Op  Subjective: Seems to have a little more pain this am but she has been sleeping and not using pain meds.  Objective: Vital signs in last 24 hours: Temp:  [98.4 F (36.9 C)-100.3 F (37.9 C)] 98.5 F (36.9 C) (12/27 0520) Pulse Rate:  [96-115] 107 (12/27 0520) Resp:  [17-22] 17 (12/27 0617) BP: (104-137)/(54-69) 118/58 mmHg (12/27 0520) SpO2:  [98 %-100 %] 99 % (12/27 0527) FiO2 (%):  [39 %-98 %] 98 % (12/27 0617)    Intake/Output from previous day: 12/26 0701 - 12/27 0700 In: 2930 [I.V.:2620; NG/GT:60; IV Piggyback:250] Out: 1300 [Urine:1115; Emesis/NG output:185] Intake/Output this shift:    Resp: clear to auscultation bilaterally Cardio: regular rate and rhythm GI: soft, quiet. wound vac intact  Lab Results:   Recent Labs  07/29/13 0425 07/30/13 0400  WBC 9.0 9.2  HGB 9.3* 8.8*  HCT 27.6* 26.2*  PLT 136* 125*   BMET  Recent Labs  07/28/13 0330 07/30/13 0400  NA 139 135  K 4.0 3.1*  CL 108 103  CO2 19 26  GLUCOSE 117* 110*  BUN 9 <3*  CREATININE 0.71 0.64  CALCIUM 7.5* 8.2*   PT/INR  Recent Labs  07/27/13 2328  LABPROT 11.8  INR 0.88   ABG  Recent Labs  07/28/13 0441 07/28/13 1223  PHART 7.229* 7.321*  HCO3 20.6 22.0    Studies/Results: No results found.  Anti-infectives: Anti-infectives   Start     Dose/Rate Route Frequency Ordered Stop   07/28/13 0600  Ampicillin-Sulbactam (UNASYN) 3 g in sodium chloride 0.9 % 100 mL IVPB     3 g 100 mL/hr over 60 Minutes Intravenous Every 8 hours 07/28/13 0224     07/27/13 2345  [MAR Hold]  Ampicillin-Sulbactam (UNASYN) 3 g in sodium chloride 0.9 % 100 mL IVPB     (On MAR Hold since 07/28/13 0006)   3 g 100 mL/hr over 60 Minutes Intravenous  Once 07/27/13 2330 07/28/13 0038      Assessment/Plan: s/p Procedure(s): EXPLORATORY LAPAROTOMY (N/A) SMALL BOWEL RESECTION (N/A) COLON RESECTION SIGMOID (N/A) IRRIGATION AND DEBRIDEMENT WOUND (Left) Continue NG and bowel  rest Ambulate with assistance D/C unasyn  LOS: 3 days    TOTH III,PAUL S 07/30/2013

## 2013-07-31 NOTE — Progress Notes (Signed)
3 Days Post-Op  Subjective: She feels much better than yesterday. Pain under good control. No flatus yet but she feels some rumbling in her belly  Objective: Vital signs in last 24 hours: Temp:  [98.2 F (36.8 C)-100.4 F (38 C)] 99.9 F (37.7 C) (12/28 0957) Pulse Rate:  [80-96] 93 (12/28 0957) Resp:  [12-20] 16 (12/28 0957) BP: (102-156)/(54-72) 122/65 mmHg (12/28 0957) SpO2:  [99 %-100 %] 100 % (12/28 0957) FiO2 (%):  [98 %] 98 % (12/27 1626)    Intake/Output from previous day: 12/27 0701 - 12/28 0700 In: 3035.4 [I.V.:3035.4] Out: 500 [Emesis/NG output:500] Intake/Output this shift:    Resp: clear to auscultation bilaterally Cardio: regular rate and rhythm GI: soft, tender near incision. few bs. vac intact  Lab Results:   Recent Labs  07/29/13 0425 07/30/13 0400  WBC 9.0 9.2  HGB 9.3* 8.8*  HCT 27.6* 26.2*  PLT 136* 125*   BMET  Recent Labs  07/30/13 0400  NA 135  K 3.1*  CL 103  CO2 26  GLUCOSE 110*  BUN <3*  CREATININE 0.64  CALCIUM 8.2*   PT/INR No results found for this basename: LABPROT, INR,  in the last 72 hours ABG  Recent Labs  07/28/13 1223  PHART 7.321*  HCO3 22.0    Studies/Results: No results found.  Anti-infectives: Anti-infectives   Start     Dose/Rate Route Frequency Ordered Stop   07/28/13 0600  Ampicillin-Sulbactam (UNASYN) 3 g in sodium chloride 0.9 % 100 mL IVPB  Status:  Discontinued     3 g 100 mL/hr over 60 Minutes Intravenous Every 8 hours 07/28/13 0224 07/30/13 0844   07/27/13 2345  [MAR Hold]  Ampicillin-Sulbactam (UNASYN) 3 g in sodium chloride 0.9 % 100 mL IVPB     (On MAR Hold since 07/28/13 0006)   3 g 100 mL/hr over 60 Minutes Intravenous  Once 07/27/13 2330 07/28/13 0038      Assessment/Plan: s/p Procedure(s): EXPLORATORY LAPAROTOMY (N/A) SMALL BOWEL RESECTION (N/A) COLON RESECTION SIGMOID (N/A) IRRIGATION AND DEBRIDEMENT WOUND (Left) continue ng for now Ambulate Await return of bowel function  LOS: 4 days    TOTH III,Atilla Zollner S 07/31/2013

## 2013-08-01 ENCOUNTER — Encounter (HOSPITAL_COMMUNITY): Payer: Self-pay | Admitting: Surgery

## 2013-08-01 DIAGNOSIS — D62 Acute posthemorrhagic anemia: Secondary | ICD-10-CM | POA: Diagnosis not present

## 2013-08-01 LAB — TYPE AND SCREEN
ABO/RH(D): A POS
Antibody Screen: POSITIVE
DAT, IgG: NEGATIVE
PT AG Type: NEGATIVE
Unit division: 0
Unit division: 0
Unit division: 0

## 2013-08-01 LAB — BASIC METABOLIC PANEL
BUN: <3 mg/dL — ABNORMAL LOW (ref 6–23)
BUN: <3 mg/dL — ABNORMAL LOW (ref 6–23)
CO2: 26 mEq/L (ref 19–32)
Calcium: 8.2 mg/dL — ABNORMAL LOW (ref 8.4–10.5)
Calcium: 8.4 mg/dL (ref 8.4–10.5)
Chloride: 104 mEq/L (ref 96–112)
Creatinine, Ser: 0.64 mg/dL (ref 0.50–1.10)
Creatinine, Ser: 0.69 mg/dL (ref 0.50–1.10)
GFR calc Af Amer: >90 mL/min (ref >90)
GFR calc Af Amer: >90 mL/min (ref >90)
GFR calc non Af Amer: >90 mL/min (ref >90)
GFR calc non Af Amer: >90 mL/min (ref >90)
Glucose, Bld: 96 mg/dL (ref 70–99)
Potassium: 2.8 mEq/L — ABNORMAL LOW (ref 3.5–5.1)

## 2013-08-01 LAB — CBC
HCT: 25.8 % — ABNORMAL LOW (ref 36.0–46.0)
Hemoglobin: 8.8 g/dL — ABNORMAL LOW (ref 12.0–15.0)
MCH: 28.1 pg (ref 26.0–34.0)
MCHC: 34.1 g/dL (ref 30.0–36.0)
RDW: 12.5 % (ref 11.5–15.5)

## 2013-08-01 MED ORDER — POTASSIUM CHLORIDE 10 MEQ/100ML IV SOLN
10.0000 meq | INTRAVENOUS | Status: AC
  Administered 2013-08-01 (×5): 10 meq via INTRAVENOUS
  Filled 2013-08-01 (×5): qty 100

## 2013-08-01 MED ORDER — POTASSIUM CHLORIDE 10 MEQ/100ML IV SOLN
10.0000 meq | INTRAVENOUS | Status: AC
  Administered 2013-08-01 (×3): 10 meq via INTRAVENOUS
  Filled 2013-08-01 (×3): qty 100

## 2013-08-01 MED ORDER — BACITRACIN ZINC 500 UNIT/GM EX OINT
TOPICAL_OINTMENT | Freq: Two times a day (BID) | CUTANEOUS | Status: DC
Start: 2013-08-01 — End: 2013-08-05
  Administered 2013-08-01 – 2013-08-04 (×8): 15.5556 via TOPICAL
  Administered 2013-08-05: 10:00:00 via TOPICAL
  Filled 2013-08-01: qty 15
  Filled 2013-08-01: qty 28.35
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
## 2013-08-01 NOTE — Consult Note (Signed)
WOC follow-up: Refer to previous progress notes on 12/26 for abd wound assessment and measurements. Vac intact with good seal.  Called bedside nurse who states she will change the dressing today and denies further questions regarding the procedure. Trauma team requests the dressing be changed Q M/W/F and bedside nurses can perform this procedure.  Please re-consult if further assistance is needed.  Thank-you,  Cammie Mcgee MSN, RN, CWOCN, [place], CNS [phone]

## 2013-08-01 NOTE — Progress Notes (Signed)
No flatus or BM.  Has some hypoactive bowel sounds.  Wound negative pressure dressing needs to be changed.  This patient has been seen and I agree with the findings and treatment plan.  Marta Lamas. Gae Bon, MD, FACS [phone] (pager) [phone] (direct pager) Trauma Surgeon
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
## 2013-08-02 DIAGNOSIS — E876 Hypokalemia: Secondary | ICD-10-CM | POA: Diagnosis not present

## 2013-08-02 LAB — BASIC METABOLIC PANEL
CO2: 24 mEq/L (ref 19–32)
Calcium: 8.4 mg/dL (ref 8.4–10.5)
Creatinine, Ser: 0.7 mg/dL (ref 0.50–1.10)
GFR calc Af Amer: >90 mL/min (ref >90)
GFR calc non Af Amer: >90 mL/min (ref >90)

## 2013-08-02 MED ORDER — POTASSIUM CHLORIDE 10 MEQ/100ML IV SOLN
10.0000 meq | INTRAVENOUS | Status: AC
  Administered 2013-08-02 (×4): 10 meq via INTRAVENOUS
  Filled 2013-08-02 (×4): qty 100
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
## 2013-08-02 NOTE — Progress Notes (Signed)
Passed some gas - will start clears. Feeling better. Going to walk. Patient examined and I agree with the assessment and plan  Violeta Gelinas, MD, MPH, FACS Pager: [phone]  08/02/2013 10:47 AM

## 2013-08-02 NOTE — Progress Notes (Signed)
UR completed.  Shanquita Ronning, RN BSN [facility] CCM Trauma/Neuro ICU Case Manager [phone]  

## 2013-08-03 LAB — BASIC METABOLIC PANEL
Calcium: 8.6 mg/dL (ref 8.4–10.5)
GFR calc Af Amer: >90 mL/min (ref >90)
GFR calc non Af Amer: >90 mL/min (ref >90)
Glucose, Bld: 90 mg/dL (ref 70–99)
Sodium: 139 mEq/L (ref 137–147)

## 2013-08-03 MED ORDER — MORPHINE SULFATE 2 MG/ML IJ SOLN
2.0000 mg | INTRAMUSCULAR | Status: DC | PRN
  Administered 2013-08-04: 2 mg via INTRAVENOUS
  Filled 2013-08-03 (×2): qty 1

## 2013-08-03 MED ORDER — HYDROCODONE-ACETAMINOPHEN 10-325 MG PO TABS
0.5000 | ORAL_TABLET | ORAL | Status: DC | PRN
  Administered 2013-08-03: 2 via ORAL
  Administered 2013-08-03 – 2013-08-05 (×6): 1 via ORAL
  Filled 2013-08-03: qty 1
  Filled 2013-08-03: qty 2
  Filled 2013-08-03 (×2): qty 1
  Filled 2013-08-03: qty 2
  Filled 2013-08-03 (×3): qty 1
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
## 2013-08-03 NOTE — Progress Notes (Signed)
Pt refuses VAC change. States that she prefers to "wait until tomorrow for the wet dressing change."

## 2013-08-03 NOTE — Progress Notes (Signed)
Looks really good today. Up in chair. Passing gas. Ambulated earlier. No BM yet. Patient examined and I agree with the assessment and plan  Violeta Gelinas, MD, MPH, FACS Pager: [phone]  08/03/2013 12:05 PM

## 2013-08-03 NOTE — Progress Notes (Addendum)
HHRN arranged with [facility] per pt choice and insurance options. Pt confirmed that the address and phone number listed in EPIC is correct. Pt also enrolled in [facility] medication assistance program.  Letter for patient placed in drawer with physical chart outside of patient room.
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
## 2013-08-05 ENCOUNTER — Encounter (HOSPITAL_COMMUNITY): Payer: Self-pay | Admitting: *Deleted

## 2013-08-05 MED ORDER — BISACODYL 10 MG RE SUPP
10.0000 mg | Freq: Once | RECTAL | Status: DC
Start: 2013-08-05 — End: 2013-08-05
  Filled 2013-08-05: qty 1

## 2013-08-05 MED ORDER — HYDROCODONE-ACETAMINOPHEN 5-325 MG PO TABS: 1.0000 | ORAL_TABLET | ORAL | Status: DC | PRN
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
## 2013-08-05 NOTE — Discharge Summary (Signed)
Alicia Seipp, MD, MPH, FACS Pager: [phone]  

## 2013-08-05 NOTE — Progress Notes (Signed)
Home after suppository Violeta GelinasBurke Emilie Carp, MD, MPH, FACS Pager: [phone]
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
## 2013-08-05 NOTE — Discharge Instructions (Signed)
No driving while taking hydrocodone.  Apply wet-to-dry dressing twice daily to your open wound. You may shower with the wound open. Pat dry before applying dressing.  Wash stapled wounds daily in shower with soap and water. Do not soak. Apply antibiotic ointment (e.g. Neosporin) twice daily and as needed to keep moist. Cover with dry dressing.  No lifting more than 5 pounds until 09/09/13.

## 2013-08-05 NOTE — Discharge Planning (Addendum)
Dressing changed midline abd and llq, reviewed technique with pt. Who verbalizes understanding, will have HHRN at dc. Copy of home instructions and rx and match letter to pt. Who verbalizes understanding. Ready for dc per w/c to private car home with all personal belongings and dressing supplies for 2 days. Discharged at 1210.

## 2013-08-10 ENCOUNTER — Ambulatory Visit (INDEPENDENT_AMBULATORY_CARE_PROVIDER_SITE_OTHER): Payer: Self-pay | Admitting: General Surgery

## 2013-08-10 ENCOUNTER — Encounter (INDEPENDENT_AMBULATORY_CARE_PROVIDER_SITE_OTHER): Payer: Self-pay | Admitting: General Surgery

## 2013-08-10 VITALS — BP 118/70 | HR 100 | Resp 16 | Ht 62.0 in | Wt 181.4 lb

## 2013-08-10 DIAGNOSIS — Z9889 Other specified postprocedural states: Secondary | ICD-10-CM

## 2013-08-10 NOTE — Patient Instructions (Signed)
Continue with twice daily dressing changes.  Follow up in 3 weeks for a wound check.
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
## 2013-08-16 ENCOUNTER — Telehealth (HOSPITAL_COMMUNITY): Payer: Self-pay | Admitting: Emergency Medicine

## 2013-08-16 NOTE — Telephone Encounter (Signed)
Left message

## 2013-08-19 ENCOUNTER — Telehealth (HOSPITAL_COMMUNITY): Payer: Self-pay | Admitting: Emergency Medicine

## 2013-08-19 NOTE — Telephone Encounter (Signed)
Patient never called back.
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
## 2013-08-24 ENCOUNTER — Encounter (INDEPENDENT_AMBULATORY_CARE_PROVIDER_SITE_OTHER): Payer: Self-pay

## 2013-08-24 ENCOUNTER — Ambulatory Visit (INDEPENDENT_AMBULATORY_CARE_PROVIDER_SITE_OTHER): Payer: No Typology Code available for payment source | Admitting: Orthopedic Surgery

## 2013-08-24 VITALS — BP 130/81 | HR 70 | Temp 97.8°F | Resp 16 | Ht 61.0 in | Wt 180.2 lb

## 2013-08-24 DIAGNOSIS — S31109A Unspecified open wound of abdominal wall, unspecified quadrant without penetration into peritoneal cavity, initial encounter: Secondary | ICD-10-CM

## 2013-08-24 DIAGNOSIS — W3400XA Accidental discharge from unspecified firearms or gun, initial encounter: Secondary | ICD-10-CM

## 2013-08-24 DIAGNOSIS — S31139A Puncture wound of abdominal wall without foreign body, unspecified quadrant without penetration into peritoneal cavity, initial encounter: Principal | ICD-10-CM

## 2013-08-24 NOTE — Patient Instructions (Signed)
Wash wounds daily in shower with soap and water. Do not soak. Apply antibiotic ointment (e.g. Neosporin) twice daily and as needed to keep moist. Continue wet-to-dry dressings twice daily.
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
## 2013-08-31 ENCOUNTER — Encounter (INDEPENDENT_AMBULATORY_CARE_PROVIDER_SITE_OTHER): Payer: Self-pay

## 2013-08-31 ENCOUNTER — Ambulatory Visit (INDEPENDENT_AMBULATORY_CARE_PROVIDER_SITE_OTHER): Payer: No Typology Code available for payment source | Admitting: General Surgery

## 2013-08-31 VITALS — Ht 62.0 in | Wt 184.0 lb

## 2013-08-31 DIAGNOSIS — Z9889 Other specified postprocedural states: Secondary | ICD-10-CM

## 2013-08-31 NOTE — Patient Instructions (Signed)
Continue with twice daily wet to dry dressing changes
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
## 2013-09-02 ENCOUNTER — Encounter: Payer: Self-pay | Admitting: Internal Medicine

## 2013-09-02 ENCOUNTER — Ambulatory Visit: Payer: Self-pay | Attending: Internal Medicine | Admitting: Internal Medicine

## 2013-09-02 VITALS — BP 116/73 | HR 83 | Temp 98.7°F | Resp 14 | Ht 62.0 in | Wt 183.4 lb

## 2013-09-02 DIAGNOSIS — L732 Hidradenitis suppurativa: Secondary | ICD-10-CM | POA: Insufficient documentation

## 2013-09-02 LAB — CBC WITH DIFFERENTIAL/PLATELET
Basophils Absolute: 0 10*3/uL (ref 0.0–0.1)
Basophils Relative: 0 % (ref 0–1)
EOS ABS: 0.1 10*3/uL (ref 0.0–0.7)
EOS PCT: 1 % (ref 0–5)
HCT: 33.3 % — ABNORMAL LOW (ref 36.0–46.0)
HEMOGLOBIN: 11.2 g/dL — AB (ref 12.0–15.0)
LYMPHS ABS: 2.3 10*3/uL (ref 0.7–4.0)
Lymphocytes Relative: 32 % (ref 12–46)
MCH: 26.6 pg (ref 26.0–34.0)
MCHC: 33.6 g/dL (ref 30.0–36.0)
MCV: 79.1 fL (ref 78.0–100.0)
MONOS PCT: 6 % (ref 3–12)
Monocytes Absolute: 0.4 10*3/uL (ref 0.1–1.0)
Neutro Abs: 4.4 10*3/uL (ref 1.7–7.7)
Neutrophils Relative %: 61 % (ref 43–77)
Platelets: 253 10*3/uL (ref 150–400)
RBC: 4.21 MIL/uL (ref 3.87–5.11)
RDW: 14.4 % (ref 11.5–15.5)
WBC: 7.2 10*3/uL (ref 4.0–10.5)

## 2013-09-02 LAB — COMPLETE METABOLIC PANEL WITH GFR
ALT: 9 U/L (ref 0–35)
AST: 14 U/L (ref 0–37)
Albumin: 4 g/dL (ref 3.5–5.2)
Alkaline Phosphatase: 58 U/L (ref 39–117)
BILIRUBIN TOTAL: 0.6 mg/dL (ref 0.2–1.2)
BUN: 7 mg/dL (ref 6–23)
CO2: 28 meq/L (ref 19–32)
CREATININE: 0.69 mg/dL (ref 0.50–1.10)
Calcium: 9.9 mg/dL (ref 8.4–10.5)
Chloride: 102 mEq/L (ref 96–112)
Glucose, Bld: 87 mg/dL (ref 70–99)
Potassium: 4.4 mEq/L (ref 3.5–5.3)
Sodium: 137 mEq/L (ref 135–145)
Total Protein: 7.5 g/dL (ref 6.0–8.3)

## 2013-09-02 LAB — LIPID PANEL
CHOLESTEROL: 211 mg/dL — AB (ref 0–200)
HDL: 84 mg/dL (ref >39)
LDL Cholesterol: 111 mg/dL — ABNORMAL HIGH (ref 0–99)
TRIGLYCERIDES: 79 mg/dL (ref <150)
Total CHOL/HDL Ratio: 2.5 Ratio
VLDL: 16 mg/dL (ref 0–40)

## 2013-09-02 LAB — TSH: TSH: 1.363 u[IU]/mL (ref 0.350–4.500)

## 2013-09-02 LAB — POCT GLYCOSYLATED HEMOGLOBIN (HGB A1C): Hemoglobin A1C: 4.7

## 2013-09-02 MED ORDER — DOXYCYCLINE HYCLATE 100 MG PO TABS: 100.0000 mg | ORAL_TABLET | Freq: Two times a day (BID) | ORAL | Status: DC

## 2013-09-02 MED ORDER — DOCUSATE SODIUM 100 MG PO CAPS: 100.0000 mg | ORAL_CAPSULE | Freq: Two times a day (BID) | ORAL | Status: DC

## 2013-09-02 MED ORDER — CLINDAMYCIN PHOSPHATE 1 % EX GEL: Freq: Two times a day (BID) | CUTANEOUS | Status: DC
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
## 2013-09-08 ENCOUNTER — Telehealth: Payer: Self-pay | Admitting: *Deleted

## 2013-09-08 NOTE — Telephone Encounter (Signed)
Message copied by Lus Kriegel, [place] R on Thu Sep 08, 2013  4:52 PM ------      Message from: Susie CassetteABROL MD, Ca[employer] Berkeley LLPNAYANA      Created: Tue Sep 06, 2013  2:23 PM       Notify patient's labs are normal except hemoglobin of 11.2, the patient should start taking a multivitamin ------

## 2013-09-14 ENCOUNTER — Encounter (INDEPENDENT_AMBULATORY_CARE_PROVIDER_SITE_OTHER): Payer: Self-pay

## 2013-09-14 ENCOUNTER — Encounter (INDEPENDENT_AMBULATORY_CARE_PROVIDER_SITE_OTHER): Payer: Self-pay | Admitting: *Deleted

## 2013-09-14 ENCOUNTER — Ambulatory Visit (INDEPENDENT_AMBULATORY_CARE_PROVIDER_SITE_OTHER): Payer: No Typology Code available for payment source | Admitting: General Surgery

## 2013-09-14 VITALS — BP 118/74 | HR 72 | Temp 98.3°F | Resp 14 | Ht 61.5 in | Wt 180.0 lb

## 2013-09-14 DIAGNOSIS — W3400XA Accidental discharge from unspecified firearms or gun, initial encounter: Secondary | ICD-10-CM

## 2013-09-14 DIAGNOSIS — S51812A Laceration without foreign body of left forearm, initial encounter: Secondary | ICD-10-CM

## 2013-09-14 DIAGNOSIS — S31109A Unspecified open wound of abdominal wall, unspecified quadrant without penetration into peritoneal cavity, initial encounter: Secondary | ICD-10-CM

## 2013-09-14 DIAGNOSIS — S31139A Puncture wound of abdominal wall without foreign body, unspecified quadrant without penetration into peritoneal cavity, initial encounter: Secondary | ICD-10-CM

## 2013-09-14 DIAGNOSIS — S51809A Unspecified open wound of unspecified forearm, initial encounter: Secondary | ICD-10-CM

## 2013-09-14 DIAGNOSIS — S36503A Unspecified injury of sigmoid colon, initial encounter: Secondary | ICD-10-CM

## 2013-09-14 DIAGNOSIS — S36509A Unspecified injury of unspecified part of colon, initial encounter: Secondary | ICD-10-CM

## 2013-09-14 DIAGNOSIS — S41109A Unspecified open wound of unspecified upper arm, initial encounter: Secondary | ICD-10-CM

## 2013-09-14 DIAGNOSIS — S36409A Unspecified injury of unspecified part of small intestine, initial encounter: Secondary | ICD-10-CM

## 2013-09-14 DIAGNOSIS — S41139A Puncture wound without foreign body of unspecified upper arm, initial encounter: Secondary | ICD-10-CM

## 2013-09-14 NOTE — Patient Instructions (Signed)
Return to work on light duty.  No heavy lifting.  Continue Wet to dry dressing changes twice daily to your abdominal wound.  Use heat and massage to hematoma site.  Use scar cream to help left arm scar.  Follow up in 3 weeks with us in the office for recheck of your wound.
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
## 2013-09-15 ENCOUNTER — Ambulatory Visit: Payer: Self-pay | Admitting: Internal Medicine

## 2013-09-21 ENCOUNTER — Ambulatory Visit: Payer: Self-pay

## 2013-10-05 ENCOUNTER — Encounter (INDEPENDENT_AMBULATORY_CARE_PROVIDER_SITE_OTHER): Payer: No Typology Code available for payment source

## 2013-10-05 ENCOUNTER — Ambulatory Visit (INDEPENDENT_AMBULATORY_CARE_PROVIDER_SITE_OTHER): Payer: No Typology Code available for payment source | Admitting: General Surgery

## 2013-10-05 ENCOUNTER — Encounter (INDEPENDENT_AMBULATORY_CARE_PROVIDER_SITE_OTHER): Payer: Self-pay

## 2013-10-05 VITALS — BP 118/76 | HR 92 | Temp 99.7°F | Resp 15 | Ht 61.5 in | Wt 181.4 lb

## 2013-10-05 DIAGNOSIS — Z9889 Other specified postprocedural states: Secondary | ICD-10-CM
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
## 2013-10-05 NOTE — Patient Instructions (Signed)
Cover the open area with a band aid or a 4x4.   You may return to normal activities.  Follow up in our office as needed.

## 2013-10-31 ENCOUNTER — Ambulatory Visit: Payer: Self-pay | Admitting: Internal Medicine

## 2014-05-27 ENCOUNTER — Encounter (HOSPITAL_COMMUNITY): Payer: Self-pay | Admitting: Emergency Medicine

## 2014-05-27 ENCOUNTER — Emergency Department (HOSPITAL_COMMUNITY)
Admission: EM | Admit: 2014-05-27 | Discharge: 2014-05-27 | Disposition: A | Discharge status: Home / Self Care | Payer: No Typology Code available for payment source | Attending: Emergency Medicine | Admitting: Emergency Medicine

## 2014-05-27 DIAGNOSIS — R110 Nausea: Secondary | ICD-10-CM | POA: Insufficient documentation

## 2014-05-27 DIAGNOSIS — L732 Hidradenitis suppurativa: Secondary | ICD-10-CM

## 2014-05-27 DIAGNOSIS — Z79899 Other long term (current) drug therapy: Secondary | ICD-10-CM | POA: Insufficient documentation

## 2014-05-27 DIAGNOSIS — Z792 Long term (current) use of antibiotics: Secondary | ICD-10-CM | POA: Insufficient documentation

## 2014-05-27 DIAGNOSIS — Z720 Tobacco use: Secondary | ICD-10-CM | POA: Insufficient documentation

## 2014-05-27 DIAGNOSIS — J45909 Unspecified asthma, uncomplicated: Secondary | ICD-10-CM | POA: Insufficient documentation

## 2014-05-27 MED ORDER — NAPROXEN 500 MG PO TABS
500.0000 mg | ORAL_TABLET | Freq: Two times a day (BID) | ORAL | Status: DC
Start: 2014-05-27 — End: 2015-05-13

## 2014-05-27 MED ORDER — DOXYCYCLINE HYCLATE 100 MG PO CAPS: 100.0000 mg | ORAL_CAPSULE | Freq: Two times a day (BID) | ORAL | Status: DC

## 2014-05-27 MED ORDER — LIDOCAINE HCL 2 % IJ SOLN
5.0000 mL | Freq: Once | INTRAMUSCULAR | Status: DC
  Filled 2014-05-27: qty 20

## 2014-05-27 NOTE — ED Notes (Signed)
She has an abscess at right axilla since this Mon.  She is in no distress.

## 2014-05-27 NOTE — Discharge Instructions (Signed)
Hidradenitis Suppurativa, Sweat Gland Abscess Hidradenitis suppurativa is a long lasting (chronic), uncommon disease of the sweat glands. With this, boil-like lumps and scarring develop in the groin, some times under the arms (axillae), and under the breasts. It may also uncommonly occur behind the ears, in the crease of the buttocks, and around the genitals.  CAUSES  The cause is from a blocking of the sweat glands. They then become infected. It may cause drainage and odor. It is not contagious. So it cannot be given to someone else. It most often shows up in puberty (about 10 to 31 years of age). But it may happen much later. It is similar to acne which is a disease of the sweat glands. This condition is slightly more common in African-Americans and women. SYMPTOMS   Hidradenitis usually starts as one or more red, tender, swellings in the groin or under the arms (axilla).  Over a period of hours to days the lesions get larger. They often open to the skin surface, draining clear to yellow-colored fluid.  The infected area heals with scarring. DIAGNOSIS  Your caregiver makes this diagnosis by looking at you. Sometimes cultures (growing germs on plates in the lab) may be taken. This is to see what germ (bacterium) is causing the infection.  TREATMENT   Topical germ killing medicine applied to the skin (antibiotics) are the treatment of choice. Antibiotics taken by mouth (systemic) are sometimes needed when the condition is getting worse or is severe.  Avoid tight-fitting clothing which traps moisture in.  Dirt does not cause hidradenitis and it is not caused by poor hygiene.  Involved areas should be cleaned daily using an antibacterial soap. Some patients find that the liquid form of Lever 2000, applied to the involved areas as a lotion after bathing, can help reduce the odor related to this condition.  Sometimes surgery is needed to drain infected areas or remove scarred tissue. Removal of  large amounts of tissue is used only in severe cases.  Birth control pills may be helpful.  Oral retinoids (vitamin A derivatives) for 6 to 12 months which are effective for acne may also help this condition.  Weight loss will improve but not cure hidradenitis. It is made worse by being overweight. But the condition is not caused by being overweight.  This condition is more common in people who have had acne.  It may become worse under stress. There is no medical cure for hidradenitis. It can be controlled, but not cured. The condition usually continues for years with periods of getting worse and getting better (remission). Document Released: 03/04/2004 Document Revised: 10/13/2011 Document Reviewed: 10/21/2013 Exit[employer] Patient Information 2015 [employer]. This information is not intended to replace advice given to you by your health care provider. Make sure you discuss any questions you have with your health care provider.  

## 2014-05-27 NOTE — ED Provider Notes (Signed)
CSN: [id number]     Arrival date & time 05/27/14  4098 History   First MD Initiated Contact with Patient 05/27/14 1013     Chief Complaint  Patient presents with  . Abscess    Patient is a 33 y.o. female presenting with abscess. The history is provided by the patient. No language interpreter was used.  Abscess Location:  Shoulder/arm Shoulder/arm abscess location:  R axilla Size:  2 cm x 1 cm Abscess quality: draining, painful and warmth   Abscess quality: no redness   Red streaking: no   Duration:  6 days Progression:  Worsening Pain details:    Quality:  Pressure   Severity:  Moderate   Duration:  6 days   Timing:  Constant   Progression:  Worsening Chronicity:  Recurrent (history of hidraadenitis) Context: not diabetes, not immunosuppression, not injected drug use, not insect bite/sting and not skin injury   Relieved by:  Warm compresses and warm water soaks Worsened by:  Draining/squeezing Ineffective treatments:  Warm compresses and warm water soaks Associated symptoms: nausea   Associated symptoms: no anorexia, no fatigue, no fever, no headaches and no vomiting   Risk factors: prior abscess   Risk factors: no family hx of MRSA and no hx of MRSA     Past Medical History  Diagnosis Date  . Asthma    Past Surgical History  Procedure Laterality Date  . Laparotomy N/A 07/28/2013    Procedure: EXPLORATORY LAPAROTOMY;  Surgeon: Clovis Pu. Cornett, MD;  Location: [facility] OR;  Service: General;  Laterality: N/A;  . Bowel resection N/A 07/28/2013    Procedure: SMALL BOWEL RESECTION;  Surgeon: Clovis Pu. Cornett, MD;  Location: [facility] OR;  Service: General;  Laterality: N/A;  . Colostomy revision N/A 07/28/2013    Procedure: COLON RESECTION SIGMOID;  Surgeon: Clovis Pu. Cornett, MD;  Location: [facility] OR;  Service: General;  Laterality: N/A;  . Incision and drainage of wound Left 07/28/2013    Procedure: IRRIGATION AND DEBRIDEMENT WOUND;  Surgeon: Clovis Pu. Cornett, MD;  Location: [facility] OR;   Service: General;  Laterality: Left;   Family History  Problem Relation Age of Onset  . Diabetes Mother   . Hypertension Maternal Grandmother   . Hypertension Paternal Grandmother    History  Substance Use Topics  . Smoking status: Current Every Day Smoker -- 1 years    Types: Pipe, Cigars    Start date: 08/04/2000  . Smokeless tobacco: Not on file     Comment: smokes 1 cigar a day  . Alcohol Use: 1.2 oz/week    2 Glasses of wine per week     Comment: not everyday, only on special occasions   OB History   Grav Para Term Preterm Abortions TAB SAB Ect Mult Living                 Review of Systems  Constitutional: Negative for fever, chills and fatigue.  Gastrointestinal: Positive for nausea. Negative for vomiting and anorexia.  Skin: Positive for wound. Negative for color change, pallor and rash.  Neurological: Negative for headaches.  All other systems reviewed and are negative.     Allergies  Review of patient's allergies indicates no known allergies.  Home Medications   Prior to Admission medications   Medication Sig Start Date End Date Taking? Authorizing Provider  clindamycin (CLINDAGEL) 1 % gel Apply topically 2 (two) times daily. 09/02/13   Richarda Overlie, MD  docusate sodium (COLACE) 100 MG capsule Take 1 capsule (  100 mg total) by mouth 2 (two) times daily. 09/02/13   Richarda OverlieNayana Abrol, MD  doxycycline (VIBRAMYCIN) 100 MG capsule Take 1 capsule (100 mg total) by mouth 2 (two) times daily. 05/27/14   Kiasha Bellin A Forcucci, PA-C  ibuprofen (ADVIL,MOTRIN) 200 MG tablet Take 400 mg by mouth every 6 (six) hours as needed. For pain    Historical Provider, MD  naproxen (NAPROSYN) 500 MG tablet Take 1 tablet (500 mg total) by mouth 2 (two) times daily. 05/27/14   Kayen Grabel A Forcucci, PA-C   BP 149/84  Pulse 102  Temp(Src) 99.2 F (37.3 C) (Oral)  Resp 17  SpO2 99%  LMP 05/01/2014 Physical Exam  Nursing note and vitals reviewed. Constitutional: She is oriented to person,  place, and time. She appears well-developed and well-nourished. No distress.  HENT:  Head: Normocephalic and atraumatic.  Mouth/Throat: Oropharynx is clear and moist. No oropharyngeal exudate.  Eyes: Conjunctivae and EOM are normal. Pupils are equal, round, and reactive to light. No scleral icterus.  Neck: Normal range of motion. Neck supple. No JVD present. No thyromegaly present.  Cardiovascular: Normal rate, regular rhythm, normal heart sounds and intact distal pulses.  Exam reveals no gallop and no friction rub.   No murmur heard. Pulmonary/Chest: Effort normal and breath sounds normal. No respiratory distress. She has no wheezes. She has no rales. She exhibits no tenderness.  Abdominal: Soft. Bowel sounds are normal.  Musculoskeletal: Normal range of motion.  Lymphadenopathy:    She has no cervical adenopathy.  Neurological: She is alert and oriented to person, place, and time.  Skin: Skin is warm and dry. She is not diaphoretic.  Multiple keloids and scars underneath bilateral armpits.  There is a 2 cm x 1 cm abscess underneath the right axilla with no evidence of erythema or warmth.  Abscess has small 1 cm linear opening and has active drainage of yellow fluids.    Psychiatric: She has a normal mood and affect. Her behavior is normal. Judgment and thought content normal.    ED Course  INCISION AND DRAINAGE Date/Time: 05/27/2014 11:10 AM Performed by: Terri PiedraFORCUCCI, Jakobe Blau A Authorized by: Terri PiedraFORCUCCI, Baleigh Rennaker A Consent: Verbal consent obtained. Risks and benefits: risks, benefits and alternatives were discussed Consent given by: patient Patient understanding: patient states understanding of the procedure being performed Patient consent: the patient's understanding of the procedure matches consent given Procedure consent: procedure consent matches procedure scheduled Relevant documents: relevant documents present and verified Test results: test results available and properly  labeled Site marked: the operative site was marked Imaging studies: imaging studies available Patient identity confirmed: verbally with patient Time out: Immediately prior to procedure a "time out" was called to verify the correct patient, procedure, equipment, support staff and site/side marked as required. Type: abscess Body area: upper extremity (right axilla) Anesthesia: local infiltration Local anesthetic: lidocaine 1% without epinephrine Anesthetic total: 3 ml Patient sedated: no Scalpel size: 10 Incision type: single straight Drainage: purulent Drainage amount: scant Comments: Patient did not tolerate procedure well and asked for the procedure to be stopped due to anxiety.  Patient already had active drainage.  4x4 gauze was placed.     (including critical care time) Labs Review Labs Reviewed - No data to display  Imaging Review No results found.   EKG Interpretation None      MDM   Final diagnoses:  Hidradenitis axillaris   Patient is a 33 y.o. Female who presents with axillary abscess.  Patient has a history of hidradenitis with  abscesses and active drainage.  Patient has no fever.  Have attempted to open abscess further but the patient asked that the I&D be stopped because of anxiety.  Patient instructed to use warm compresses.  Patient to be discharged with doxycycline BID.  Patient to return for fever, chills, nausea, vomiting.  Patient states understanding and agreement.  Patient is stable for discharge.      Eben Burowourtney A Forcucci, PA-C 05/27/14 1115

## 2014-05-29 NOTE — ED Provider Notes (Signed)
Medical screening examination/treatment/procedure(s) were performed by non-physician practitioner and as supervising physician I was immediately available for consultation/collaboration.   Nelia Shiobert L Silvio Sausedo, MD 05/29/14 2042

## 2015-05-12 ENCOUNTER — Emergency Department (HOSPITAL_COMMUNITY)
Admission: EM | Admit: 2015-05-12 | Discharge: 2015-05-13 | Disposition: A | Discharge status: Home / Self Care | Payer: No Typology Code available for payment source | Attending: Emergency Medicine | Admitting: Emergency Medicine

## 2015-05-12 ENCOUNTER — Encounter (HOSPITAL_COMMUNITY): Payer: Self-pay | Admitting: Oncology

## 2015-05-12 DIAGNOSIS — N898 Other specified noninflammatory disorders of vagina: Secondary | ICD-10-CM | POA: Insufficient documentation

## 2015-05-12 DIAGNOSIS — Z87828 Personal history of other (healed) physical injury and trauma: Secondary | ICD-10-CM | POA: Insufficient documentation

## 2015-05-12 DIAGNOSIS — Z113 Encounter for screening for infections with a predominantly sexual mode of transmission: Secondary | ICD-10-CM | POA: Insufficient documentation

## 2015-05-12 DIAGNOSIS — Z720 Tobacco use: Secondary | ICD-10-CM | POA: Insufficient documentation

## 2015-05-12 DIAGNOSIS — J45909 Unspecified asthma, uncomplicated: Secondary | ICD-10-CM | POA: Insufficient documentation

## 2015-05-12 DIAGNOSIS — Z79899 Other long term (current) drug therapy: Secondary | ICD-10-CM | POA: Insufficient documentation

## 2015-05-12 HISTORY — DX: Accidental discharge from unspecified firearms or gun, initial encounter: W34.00XA

## 2015-05-12 NOTE — ED Notes (Signed)
Per pt she presents d/t finding out her significant other had been unfaithful and she wants to be checked for STD's.  Pt reports bumps to groin.  Does have hx of boils.

## 2015-05-13 LAB — WET PREP, GENITAL
Trich, Wet Prep: NONE SEEN
Yeast Wet Prep HPF POC: NONE SEEN

## 2015-05-13 LAB — URINE MICROSCOPIC-ADD ON

## 2015-05-13 LAB — PREGNANCY, URINE: PREG TEST UR: NEGATIVE

## 2015-05-13 LAB — URINALYSIS, ROUTINE W REFLEX MICROSCOPIC
BILIRUBIN URINE: NEGATIVE
Glucose, UA: NEGATIVE mg/dL
Ketones, ur: NEGATIVE mg/dL
Leukocytes, UA: NEGATIVE
Nitrite: NEGATIVE
Protein, ur: NEGATIVE mg/dL
Specific Gravity, Urine: 1.02 (ref 1.005–1.030)
UROBILINOGEN UA: 1 mg/dL (ref 0.0–1.0)
pH: 6 (ref 5.0–8.0)
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
## 2015-05-13 NOTE — Discharge Instructions (Signed)
Follow-up with the Health Department regarding a result of your STD tests. If you require treatment, do not engage in sex for 1 week after you have completed your treatment. Notify all sexual partners if you are positive for STDs. Apply warm compresses to your boils to promote drainage.  Safe Sex Safe sex is about reducing the risk of giving or getting a sexually transmitted disease (STD). STDs are spread through sexual contact involving the genitals, mouth, or rectum. Some STDs can be cured and others cannot. Safe sex can also prevent unintended pregnancies.  WHAT ARE SOME SAFE SEX PRACTICES?  Limit your sexual activity to only one partner who is having sex with only you.  Talk to your partner about his or her past partners, past STDs, and drug use.  Use a condom every time you have sexual intercourse. This includes vaginal, oral, and anal sexual activity. Both females and males should wear condoms during oral sex. Only use latex or polyurethane condoms and water-based lubricants. Using petroleum-based lubricants or oils to lubricate a condom will weaken the condom and increase the chance that it will break. The condom should be in place from the beginning to the end of sexual activity. Wearing a condom reduces, but does not completely eliminate, your risk of getting or giving an STD. STDs can be spread by contact with infected body fluids and skin.  Get vaccinated for hepatitis B and HPV.  Avoid alcohol and recreational drugs, which can affect your judgment. You may forget to use a condom or participate in high-risk sex.  For females, avoid douching after sexual intercourse. Douching can spread an infection farther into the reproductive tract.  Check your body for signs of sores, blisters, rashes, or unusual discharge. See your health care provider if you notice any of these signs.  Avoid sexual contact if you have symptoms of an infection or are being treated for an STD. If you or your partner  has herpes, avoid sexual contact when blisters are present. Use condoms at all other times.  If you are at risk of being infected with HIV, it is recommended that you take a prescription medicine daily to prevent HIV infection. This is called pre-exposure prophylaxis (PrEP). You are considered at risk if:  You are a man who has sex with other men (MSM).  You are a heterosexual man or woman who is sexually active with more than one partner.  You take drugs by injection.  You are sexually active with a partner who has HIV.  Talk with your health care provider about whether you are at high risk of being infected with HIV. If you choose to begin PrEP, you should first be tested for HIV. You should then be tested every 3 months for as long as you are taking PrEP.  See your health care provider for regular screenings, exams, and tests for other STDs. Before having sex with a new partner, each of you should be screened for STDs and should talk about the results with each other. WHAT ARE THE BENEFITS OF SAFE SEX?   There is less chance of getting or giving an STD.  You can prevent unwanted or unintended pregnancies.  By discussing safe sex concerns with your partner, you may increase feelings of intimacy, comfort, trust, and honesty between the two of you.   This information is not intended to replace advice given to you by your health care provider. Make sure you discuss any questions you have with your health care  provider.   Document Released: 08/28/2004 Document Revised: 08/11/2014 Document Reviewed: 01/12/2012 Elsevier Interactive Patient Education [employer]

## 2015-05-15 LAB — GC/CHLAMYDIA PROBE AMP (~~LOC~~) NOT AT ARMC
Chlamydia: NEGATIVE
Neisseria Gonorrhea: POSITIVE — AB

## 2015-05-16 ENCOUNTER — Telehealth (HOSPITAL_BASED_OUTPATIENT_CLINIC_OR_DEPARTMENT_OTHER): Payer: Self-pay | Admitting: Emergency Medicine

## 2015-05-16 NOTE — Telephone Encounter (Signed)
Chart handoff to EDP for treatment plan + GC

## 2016-05-03 ENCOUNTER — Emergency Department (HOSPITAL_COMMUNITY): Payer: Self-pay

## 2016-05-03 ENCOUNTER — Encounter (HOSPITAL_COMMUNITY): Payer: Self-pay

## 2016-05-03 ENCOUNTER — Emergency Department (HOSPITAL_COMMUNITY)
Admission: EM | Admit: 2016-05-03 | Discharge: 2016-05-03 | Discharge status: Against Medical Advice | Payer: Self-pay | Attending: Emergency Medicine | Admitting: Emergency Medicine

## 2016-05-03 DIAGNOSIS — J45909 Unspecified asthma, uncomplicated: Secondary | ICD-10-CM | POA: Insufficient documentation

## 2016-05-03 DIAGNOSIS — R1032 Left lower quadrant pain: Secondary | ICD-10-CM | POA: Insufficient documentation

## 2016-05-03 DIAGNOSIS — F17210 Nicotine dependence, cigarettes, uncomplicated: Secondary | ICD-10-CM | POA: Insufficient documentation

## 2016-05-03 LAB — URINALYSIS, ROUTINE W REFLEX MICROSCOPIC
Bilirubin Urine: NEGATIVE
GLUCOSE, UA: NEGATIVE mg/dL
HGB URINE DIPSTICK: NEGATIVE
Ketones, ur: NEGATIVE mg/dL
LEUKOCYTES UA: NEGATIVE
Nitrite: NEGATIVE
PH: 6 (ref 5.0–8.0)
PROTEIN: NEGATIVE mg/dL
Specific Gravity, Urine: 1.028 (ref 1.005–1.030)

## 2016-05-03 LAB — PREGNANCY, URINE: Preg Test, Ur: NEGATIVE

## 2016-05-03 MED ORDER — HYDROMORPHONE HCL 1 MG/ML IJ SOLN: 1.0000 mg | Freq: Once | INTRAMUSCULAR | Status: DC

## 2016-05-03 MED ORDER — ONDANSETRON HCL 4 MG/2ML IJ SOLN: 4.0000 mg | Freq: Once | INTRAMUSCULAR | Status: DC

## 2016-05-03 MED ORDER — SODIUM CHLORIDE 0.9 % IV BOLUS (SEPSIS): 500.0000 mL | Freq: Once | INTRAVENOUS | Status: DC

## 2016-05-03 MED ORDER — SODIUM CHLORIDE 0.9 % IV SOLN: INTRAVENOUS | Status: DC
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
## 2016-05-03 NOTE — ED Notes (Signed)
Patient refusing IV, CT Scan, or IV fluids
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
## 2017-10-05 ENCOUNTER — Other Ambulatory Visit: Payer: Self-pay

## 2017-10-05 ENCOUNTER — Emergency Department (HOSPITAL_COMMUNITY)
Admission: EM | Admit: 2017-10-05 | Discharge: 2017-10-05 | Disposition: A | Discharge status: Home / Self Care | Payer: Self-pay | Attending: Emergency Medicine | Admitting: Emergency Medicine

## 2017-10-05 ENCOUNTER — Emergency Department (HOSPITAL_COMMUNITY): Payer: Self-pay

## 2017-10-05 DIAGNOSIS — M545 Low back pain, unspecified: Secondary | ICD-10-CM

## 2017-10-05 DIAGNOSIS — R0789 Other chest pain: Secondary | ICD-10-CM

## 2017-10-05 DIAGNOSIS — J45909 Unspecified asthma, uncomplicated: Secondary | ICD-10-CM | POA: Insufficient documentation

## 2017-10-05 DIAGNOSIS — F17210 Nicotine dependence, cigarettes, uncomplicated: Secondary | ICD-10-CM | POA: Insufficient documentation

## 2017-10-05 LAB — BASIC METABOLIC PANEL
Anion gap: 4 — ABNORMAL LOW (ref 5–15)
BUN: 13 mg/dL (ref 6–20)
CHLORIDE: 108 mmol/L (ref 101–111)
CO2: 27 mmol/L (ref 22–32)
Calcium: 9.3 mg/dL (ref 8.9–10.3)
Creatinine, Ser: 0.84 mg/dL (ref 0.44–1.00)
GFR calc Af Amer: >60 mL/min (ref >60)
GLUCOSE: 95 mg/dL (ref 65–99)
POTASSIUM: 3.7 mmol/L (ref 3.5–5.1)
Sodium: 139 mmol/L (ref 135–145)

## 2017-10-05 LAB — D-DIMER, QUANTITATIVE: D-Dimer, Quant: 0.48 ug/mL-FEU (ref 0.00–0.50)

## 2017-10-05 LAB — CBC
HCT: 37.1 % (ref 36.0–46.0)
HEMOGLOBIN: 12.4 g/dL (ref 12.0–15.0)
MCH: 27.9 pg (ref 26.0–34.0)
MCHC: 33.4 g/dL (ref 30.0–36.0)
MCV: 83.4 fL (ref 78.0–100.0)
Platelets: 203 10*3/uL (ref 150–400)
RBC: 4.45 MIL/uL (ref 3.87–5.11)
RDW: 13.5 % (ref 11.5–15.5)
WBC: 7.5 10*3/uL (ref 4.0–10.5)

## 2017-10-05 LAB — I-STAT BETA HCG BLOOD, ED (MC, WL, AP ONLY)

## 2017-10-05 LAB — I-STAT TROPONIN, ED: Troponin i, poc: 0 ng/mL (ref 0.00–0.08)

## 2017-10-05 MED ORDER — CYCLOBENZAPRINE HCL 10 MG PO TABS: 10.0000 mg | ORAL_TABLET | Freq: Three times a day (TID) | ORAL | 0 refills | Status: DC | PRN

## 2017-10-05 MED ORDER — HYDROMORPHONE HCL 1 MG/ML IJ SOLN
1.0000 mg | Freq: Once | INTRAMUSCULAR | Status: AC
  Administered 2017-10-05: 1 mg via INTRAVENOUS
  Filled 2017-10-05: qty 1

## 2017-10-05 MED ORDER — TRAMADOL HCL 50 MG PO TABS: 50.0000 mg | ORAL_TABLET | Freq: Four times a day (QID) | ORAL | 0 refills | Status: DC | PRN

## 2017-10-05 MED ORDER — ONDANSETRON HCL 4 MG/2ML IJ SOLN
4.0000 mg | Freq: Once | INTRAMUSCULAR | Status: AC
  Administered 2017-10-05: 4 mg via INTRAVENOUS
  Filled 2017-10-05: qty 2

## 2017-10-05 NOTE — ED Triage Notes (Signed)
Patient is complaining of lower back pain that has been going on for a couple of weeks. Patient is also complaining of chest pain.
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
## 2018-09-01 ENCOUNTER — Encounter: Payer: Self-pay | Admitting: Family Medicine

## 2018-09-01 ENCOUNTER — Ambulatory Visit: Payer: BLUE CROSS/BLUE SHIELD | Admitting: Family Medicine

## 2018-09-01 VITALS — BP 94/60 | HR 99 | Temp 98.7°F | Ht 61.5 in | Wt 215.6 lb

## 2018-09-01 DIAGNOSIS — M940 Chondrocostal junction syndrome [Tietze]: Secondary | ICD-10-CM

## 2018-09-01 DIAGNOSIS — Z7689 Persons encountering health services in other specified circumstances: Secondary | ICD-10-CM | POA: Diagnosis not present

## 2018-09-01 DIAGNOSIS — J4520 Mild intermittent asthma, uncomplicated: Secondary | ICD-10-CM | POA: Diagnosis not present

## 2018-09-01 MED ORDER — ALBUTEROL SULFATE HFA 108 (90 BASE) MCG/ACT IN AERS: 2.0000 | INHALATION_SPRAY | Freq: Four times a day (QID) | RESPIRATORY_TRACT | 2 refills | Status: DC | PRN
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
## 2018-09-01 NOTE — Patient Instructions (Signed)
Use heating pad and/or ice pack 2x/day Take 600-800mg  ibuprofen twice per day with food x 7-10 days Avoid heavy lifting, pulling, pushing, etc x 1-2 weeks   Costochondritis  Costochondritis is swelling and irritation (inflammation) of the tissue (cartilage) that connects your ribs to your breastbone (sternum). This causes pain in the front of your chest. The pain usually starts gradually and involves more than one rib. What are the causes? The exact cause of this condition is not always known. It results from stress on the cartilage where your ribs attach to your sternum. The cause of this stress could be:  Chest injury (trauma).  Exercise or activity, such as lifting.  Severe coughing. What increases the risk? You may be at higher risk for this condition if you:  Are female.  Are 71?36 years old.  Recently started a new exercise or work activity.  Have low levels of vitamin D.  Have a condition that makes you cough frequently. What are the signs or symptoms? The main symptom of this condition is chest pain. The pain:  Usually starts gradually and can be sharp or dull.  Gets worse with deep breathing, coughing, or exercise.  Gets better with rest.  May be worse when you press on the sternum-rib connection (tenderness). How is this diagnosed? This condition is diagnosed based on your symptoms, medical history, and a physical exam. Your health care provider will check for tenderness when pressing on your sternum. This is the most important finding. You may also have tests to rule out other causes of chest pain. These may include:  A chest X-ray to check for lung problems.  An electrocardiogram (ECG) to see if you have a heart problem that could be causing the pain.  An imaging scan to rule out a chest or rib fracture. How is this treated? This condition usually goes away on its own over time. Your health care provider may prescribe an NSAID to reduce pain and  inflammation. Your health care provider may also suggest that you:  Rest and avoid activities that make pain worse.  Apply heat or cold to the area to reduce pain and inflammation.  Do exercises to stretch your chest muscles. If these treatments do not help, your health care provider may inject a numbing medicine at the sternum-rib connection to help relieve the pain. Follow these instructions at home:  Avoid activities that make pain worse. This includes any activities that use chest, abdominal, and side muscles.  If directed, put ice on the painful area: ? Put ice in a plastic bag. ? Place a towel between your skin and the bag. ? Leave the ice on for 20 minutes, 2-3 times a day.  If directed, apply heat to the affected area as often as told by your health care provider. Use the heat source that your health care provider recommends, such as a moist heat pack or a heating pad. ? Place a towel between your skin and the heat source. ? Leave the heat on for 20-30 minutes. ? Remove the heat if your skin turns bright red. This is especially important if you are unable to feel pain, heat, or cold. You may have a greater risk of getting burned.  Take over-the-counter and prescription medicines only as told by your health care provider.  Return to your normal activities as told by your health care provider. Ask your health care provider what activities are safe for you.  Keep all follow-up visits as told by your  health care provider. This is important. Contact a health care provider if:  You have chills or a fever.  Your pain does not go away or it gets worse.  You have a cough that does not go away (is persistent). Get help right away if:  You have shortness of breath. This information is not intended to replace advice given to you by your health care provider. Make sure you discuss any questions you have with your health care provider. Document Released: 04/30/2005 Document Revised:  04/22/2017 Document Reviewed: 11/14/2015 Elsevier Interactive Patient Education  [employer].

## 2018-09-15 ENCOUNTER — Encounter: Payer: BLUE CROSS/BLUE SHIELD | Admitting: Family Medicine

## 2018-12-22 ENCOUNTER — Telehealth: Payer: Self-pay | Admitting: *Deleted

## 2018-12-22 ENCOUNTER — Telehealth (INDEPENDENT_AMBULATORY_CARE_PROVIDER_SITE_OTHER): Payer: BLUE CROSS/BLUE SHIELD | Admitting: Family Medicine

## 2018-12-22 ENCOUNTER — Encounter: Payer: Self-pay | Admitting: Family Medicine

## 2018-12-22 VITALS — Temp 97.3°F | Wt 116.0 lb

## 2018-12-22 DIAGNOSIS — R197 Diarrhea, unspecified: Secondary | ICD-10-CM | POA: Diagnosis not present

## 2018-12-22 DIAGNOSIS — R112 Nausea with vomiting, unspecified: Secondary | ICD-10-CM | POA: Diagnosis not present

## 2018-12-22 DIAGNOSIS — Z20822 Contact with and (suspected) exposure to covid-19: Secondary | ICD-10-CM

## 2018-12-22 DIAGNOSIS — R6889 Other general symptoms and signs: Secondary | ICD-10-CM

## 2018-12-22 MED ORDER — ONDANSETRON HCL 4 MG PO TABS: 4.0000 mg | ORAL_TABLET | Freq: Three times a day (TID) | ORAL | 0 refills | Status: DC | PRN
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
## 2018-12-22 NOTE — Telephone Encounter (Signed)
Attempted to call pt to schedule for COVID-19 test however no answer so message left to call us back at [phone] and any nurse could get her scheduled.

## 2018-12-22 NOTE — Telephone Encounter (Signed)
Referred for Covid-19 testing due to symptoms by Dr. Barron Alvine. Appointment made for 12/23/18 @11 :00am at [facility] location. Patient aware to wear mask and stay in vehicle.

## 2018-12-23 ENCOUNTER — Other Ambulatory Visit: Payer: BLUE CROSS/BLUE SHIELD

## 2018-12-23 DIAGNOSIS — R6889 Other general symptoms and signs: Secondary | ICD-10-CM | POA: Diagnosis not present

## 2018-12-23 DIAGNOSIS — Z20822 Contact with and (suspected) exposure to covid-19: Secondary | ICD-10-CM

## 2018-12-24 ENCOUNTER — Telehealth (INDEPENDENT_AMBULATORY_CARE_PROVIDER_SITE_OTHER): Payer: BLUE CROSS/BLUE SHIELD | Admitting: Family Medicine

## 2018-12-24 ENCOUNTER — Encounter: Payer: Self-pay | Admitting: Family Medicine

## 2018-12-24 DIAGNOSIS — N939 Abnormal uterine and vaginal bleeding, unspecified: Secondary | ICD-10-CM
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
## 2018-12-26 LAB — NOVEL CORONAVIRUS, NAA: SARS-CoV-2, NAA: NOT DETECTED

## 2018-12-30 ENCOUNTER — Encounter: Payer: Self-pay | Admitting: Family Medicine

## 2018-12-30 ENCOUNTER — Telehealth (INDEPENDENT_AMBULATORY_CARE_PROVIDER_SITE_OTHER): Payer: BLUE CROSS/BLUE SHIELD | Admitting: Family Medicine

## 2018-12-30 VITALS — Ht 61.5 in | Wt 213.0 lb

## 2018-12-30 DIAGNOSIS — R102 Pelvic and perineal pain: Secondary | ICD-10-CM | POA: Diagnosis not present

## 2018-12-30 DIAGNOSIS — N939 Abnormal uterine and vaginal bleeding, unspecified: Secondary | ICD-10-CM | POA: Diagnosis not present
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
## 2019-01-03 ENCOUNTER — Telehealth: Payer: Self-pay

## 2019-01-03 NOTE — Telephone Encounter (Signed)
Spoke with pt. Pt is going to fax FMLA paper work over and pt aware Dr. Salena Saner isn't in the office until Thursday to fill the paperwork out. I will fax the paper over once completed.

## 2019-01-03 NOTE — Telephone Encounter (Signed)
Copied from CRM [id number]. Topic: General - Inquiry >> Jan 03, 2019  7:54 AM Crist Infante wrote: Reason for CRM: pt has a return to work form from her employer she needs filled out ASAP.  Pt would like a call back to set up time she can drop off. Pt anxious to return to [employer].

## 2019-01-05 ENCOUNTER — Other Ambulatory Visit: Payer: BLUE CROSS/BLUE SHIELD

## 2019-01-06 NOTE — Telephone Encounter (Signed)
I have received FMLA paperwork given to Dr. Salena Saner to fill out

## 2019-01-10 NOTE — Telephone Encounter (Signed)
Paperwork faxed, copy made to be scanned. Patient is aware.

## 2019-01-14 ENCOUNTER — Ambulatory Visit
Admission: RE | Admit: 2019-01-14 | Discharge: 2019-01-14 | Disposition: A | Discharge status: Home / Self Care | Payer: BC Managed Care – PPO | Source: Ambulatory Visit | Attending: Family Medicine | Admitting: Family Medicine

## 2019-01-14 DIAGNOSIS — R102 Pelvic and perineal pain: Secondary | ICD-10-CM

## 2019-01-14 DIAGNOSIS — N839 Noninflammatory disorder of ovary, fallopian tube and broad ligament, unspecified: Secondary | ICD-10-CM | POA: Diagnosis not present

## 2019-01-14 DIAGNOSIS — D251 Intramural leiomyoma of uterus: Secondary | ICD-10-CM | POA: Diagnosis not present

## 2019-01-14 DIAGNOSIS — N939 Abnormal uterine and vaginal bleeding, unspecified: Secondary | ICD-10-CM

## 2019-01-19 ENCOUNTER — Encounter: Payer: Self-pay | Admitting: Family Medicine

## 2019-01-20 ENCOUNTER — Telehealth: Payer: Self-pay | Admitting: General Practice

## 2019-01-20 ENCOUNTER — Telehealth: Payer: Self-pay

## 2019-01-20 DIAGNOSIS — Z20822 Contact with and (suspected) exposure to covid-19: Secondary | ICD-10-CM

## 2019-01-20 NOTE — Addendum Note (Signed)
Addended by: Dimple Nanas on: 01/20/2019 03:23 PM   Modules accepted: Orders

## 2019-01-20 NOTE — Telephone Encounter (Signed)
I can submit request to have her tested and she will be contacted to set up testing appt if testing is approved.

## 2019-01-20 NOTE — Telephone Encounter (Signed)
Pt aware of the request and has been set up to get tested on 6/19

## 2019-01-20 NOTE — Telephone Encounter (Signed)
Dr. Loletha Grayer please advise Copied from [place] [phone]. 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 Her employer will not have her tested unless she shows symptoms. She would like to know if Dr. Loletha Grayer would get her tested again as she does not want to wait until she starts showing symptoms. Please advise.

## 2019-01-20 NOTE — Telephone Encounter (Signed)
-----   Message from Ronnald Nian, DO sent at 01/20/2019  2:36 PM EDT ----- Regarding: COVID Testing Alicia Cannon D.O.B. 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 She was off from work for 2 days (normal for her schedule) and when she returned to work for her next shift, she was told a pt with whom she works directly was taken to the hospital and tested positive for COVID-19. Pt is requesting testing due to direct exposure to COVID positive patient.  Thanks, Dr. Bryan Lemma

## 2019-01-20 NOTE — Telephone Encounter (Signed)
Pt has been scheduled for covid testing.  Scheduled apt with pt directly. Pt was referred by: Ronnald Nian, DO

## 2019-01-21 ENCOUNTER — Other Ambulatory Visit: Payer: BC Managed Care – PPO

## 2019-01-21 ENCOUNTER — Encounter: Payer: Self-pay | Admitting: Family Medicine

## 2019-01-21 DIAGNOSIS — R6889 Other general symptoms and signs: Secondary | ICD-10-CM | POA: Diagnosis not present

## 2019-01-21 DIAGNOSIS — Z20822 Contact with and (suspected) exposure to covid-19: Secondary | ICD-10-CM

## 2019-01-21 NOTE — Telephone Encounter (Signed)
Patient calling and states that she looked in her mychart and is unable to see the letter there. Would like to know if it could be resent to her through [place], but also faxed to her employer? Fax#: [phone] Attn [place]

## 2019-01-25 LAB — NOVEL CORONAVIRUS, NAA: SARS-CoV-2, NAA: NOT DETECTED

## 2019-01-28 ENCOUNTER — Ambulatory Visit: Payer: Self-pay | Admitting: Obstetrics & Gynecology

## 2019-02-11 ENCOUNTER — Other Ambulatory Visit: Payer: Self-pay

## 2019-02-14 ENCOUNTER — Ambulatory Visit: Payer: Self-pay | Admitting: Obstetrics & Gynecology

## 2019-04-26 ENCOUNTER — Encounter: Payer: Self-pay | Admitting: Family Medicine

## 2019-04-27 ENCOUNTER — Encounter: Payer: Self-pay | Admitting: Family Medicine

## 2019-04-27 ENCOUNTER — Telehealth (INDEPENDENT_AMBULATORY_CARE_PROVIDER_SITE_OTHER): Payer: BC Managed Care – PPO | Admitting: Family Medicine

## 2019-04-27 ENCOUNTER — Other Ambulatory Visit: Payer: Self-pay

## 2019-04-27 DIAGNOSIS — F329 Major depressive disorder, single episode, unspecified: Secondary | ICD-10-CM

## 2019-04-27 DIAGNOSIS — R4589 Other symptoms and signs involving emotional state: Secondary | ICD-10-CM
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
## 2019-04-29 ENCOUNTER — Telehealth: Payer: Self-pay | Admitting: Family Medicine

## 2019-04-29 NOTE — Telephone Encounter (Signed)
Spoke with pt made pt aware printed letter for her it will be waiting upfront

## 2019-04-29 NOTE — Telephone Encounter (Signed)
Patient is calling can the work letter be printed for her. Can she come today to pick it up. Please advise CB- [phone]

## 2019-05-11 NOTE — Telephone Encounter (Signed)
Per last OV/work note - pt is able to return to work today. Letter made & sent through mychart to pt.

## 2019-05-11 NOTE — Telephone Encounter (Signed)
Patient called in again needing work note to say she can return to work without any restrictions and up loaded to her MyChart. please advise

## 2019-05-13 NOTE — Telephone Encounter (Signed)
Letter printed & faxed

## 2019-05-13 NOTE — Telephone Encounter (Signed)
Pt need return to work note fax to   [phone] Attn: Lovey Newcomer

## 2019-07-04 ENCOUNTER — Telehealth: Payer: Self-pay

## 2019-07-04 NOTE — Telephone Encounter (Signed)
Noted, will keep an eye out for papers.

## 2019-07-04 NOTE — Telephone Encounter (Signed)
Placed on pcp's desk to be completed.

## 2019-07-04 NOTE — Telephone Encounter (Signed)
Paperwork received.

## 2019-07-04 NOTE — Telephone Encounter (Signed)
Copied from [place] [phone]. Topic: General - Inquiry >> Jul 01, 2019  2:19 PM Richardo Priest, [place] wrote: Reason for CRM: Pt called in stating she will be faxing FMLA paperwork. Please advise.

## 2019-07-06 ENCOUNTER — Other Ambulatory Visit: Payer: Self-pay

## 2019-07-06 NOTE — Telephone Encounter (Signed)
I'm not sure what this FMLA paperwork is for. I saw pt in  04/2019 for depressed mood and she was taking off from work 9/21-10/7. Am I filling out this FMLA paperwork now in 07/2019 for this missed work 2 mo ago? It states it is/was due 05/23/19

## 2019-07-06 NOTE — Telephone Encounter (Signed)
Pt is coming into the office tomorrow on 12/3 to see pcp.

## 2019-07-07 ENCOUNTER — Other Ambulatory Visit (HOSPITAL_COMMUNITY)
Admission: RE | Admit: 2019-07-07 | Discharge: 2019-07-07 | Disposition: A | Discharge status: Home / Self Care | Payer: BC Managed Care – PPO | Source: Ambulatory Visit | Attending: Family Medicine | Admitting: Family Medicine

## 2019-07-07 ENCOUNTER — Ambulatory Visit (INDEPENDENT_AMBULATORY_CARE_PROVIDER_SITE_OTHER): Payer: BC Managed Care – PPO | Admitting: Family Medicine

## 2019-07-07 ENCOUNTER — Encounter: Payer: Self-pay | Admitting: Family Medicine

## 2019-07-07 VITALS — BP 114/72 | HR 98 | Temp 98.6°F | Ht 61.5 in | Wt 212.6 lb

## 2019-07-07 DIAGNOSIS — J4520 Mild intermittent asthma, uncomplicated: Secondary | ICD-10-CM | POA: Diagnosis not present

## 2019-07-07 DIAGNOSIS — Z124 Encounter for screening for malignant neoplasm of cervix: Secondary | ICD-10-CM | POA: Insufficient documentation

## 2019-07-07 DIAGNOSIS — Z0000 Encounter for general adult medical examination without abnormal findings: Secondary | ICD-10-CM

## 2019-07-07 LAB — BASIC METABOLIC PANEL
BUN: 11 mg/dL (ref 6–23)
CO2: 29 mEq/L (ref 19–32)
Calcium: 9.7 mg/dL (ref 8.4–10.5)
Chloride: 105 mEq/L (ref 96–112)
Creatinine, Ser: 0.79 mg/dL (ref 0.40–1.20)
GFR: 99.4 mL/min (ref >60.00)
Glucose, Bld: 93 mg/dL (ref 70–99)
Potassium: 4.9 mEq/L (ref 3.5–5.1)
Sodium: 139 mEq/L (ref 135–145)

## 2019-07-07 LAB — AST: AST: 20 U/L (ref 0–37)

## 2019-07-07 LAB — CBC
HCT: 40.5 % (ref 36.0–46.0)
Hemoglobin: 13.4 g/dL (ref 12.0–15.0)
MCHC: 33.1 g/dL (ref 30.0–36.0)
MCV: 84 fl (ref 78.0–100.0)
Platelets: 192 10*3/uL (ref 150.0–400.0)
RBC: 4.83 Mil/uL (ref 3.87–5.11)
RDW: 13.9 % (ref 11.5–15.5)
WBC: 5.3 10*3/uL (ref 4.0–10.5)

## 2019-07-07 LAB — VITAMIN D 25 HYDROXY (VIT D DEFICIENCY, FRACTURES): VITD: 11.07 ng/mL — ABNORMAL LOW (ref 30.00–100.00)

## 2019-07-07 LAB — LIPID PANEL
Cholesterol: 229 mg/dL — ABNORMAL HIGH (ref 0–200)
HDL: 87.7 mg/dL (ref >39.00)
LDL Cholesterol: 132 mg/dL — ABNORMAL HIGH (ref 0–99)
NonHDL: 141.45
Total CHOL/HDL Ratio: 3
Triglycerides: 46 mg/dL (ref 0.0–149.0)
VLDL: 9.2 mg/dL (ref 0.0–40.0)

## 2019-07-07 LAB — ALT: ALT: 13 U/L (ref 0–35)
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
## 2019-07-07 NOTE — Patient Instructions (Addendum)
[facility]  Health Maintenance, Female Adopting a healthy lifestyle and getting preventive care are important in promoting health and wellness. Ask your health care provider about:  The right schedule for you to have regular tests and exams.  Things you can do on your own to prevent diseases and keep yourself healthy. What should I know about diet, weight, and exercise? Eat a healthy diet   Eat a diet that includes plenty of vegetables, fruits, low-fat dairy products, and lean protein.  Do not eat a lot of foods that are high in solid fats, added sugars, or sodium. Maintain a healthy weight Body mass index (BMI) is used to identify weight problems. It estimates body fat based on height and weight. Your health care provider can help determine your BMI and help you achieve or maintain a healthy weight. Get regular exercise Get regular exercise. This is one of the most important things you can do for your health. Most adults should:  Exercise for at least 150 minutes each week. The exercise should increase your heart rate and make you sweat (moderate-intensity exercise).  Do strengthening exercises at least twice a week. This is in addition to the moderate-intensity exercise.  Spend less time sitting. Even light physical activity can be beneficial. Watch cholesterol and blood lipids Have your blood tested for lipids and cholesterol at 36 years of age, then have this test every 5 years. Have your cholesterol levels checked more often if:  Your lipid or cholesterol levels are high.  You are older than 36 years of age.  You are at high risk for heart disease. What should I know about cancer screening? Depending on your health history and family history, you may need to have cancer screening at various ages. This may include screening for:  Breast cancer.  Cervical cancer.  Colorectal cancer.  Skin cancer.  Lung cancer. What should I know about heart  disease, diabetes, and high blood pressure? Blood pressure and heart disease  High blood pressure causes heart disease and increases the risk of stroke. This is more likely to develop in people who have high blood pressure readings, are of African descent, or are overweight.  Have your blood pressure checked: ? Every 3-5 years if you are 92-56 years of age. ? Every year if you are 32 years old or older. Diabetes Have regular diabetes screenings. This checks your fasting blood sugar level. Have the screening done:  Once every three years after age 98 if you are at a normal weight and have a low risk for diabetes.  More often and at a younger age if you are overweight or have a high risk for diabetes. What should I know about preventing infection? Hepatitis B If you have a higher risk for hepatitis B, you should be screened for this virus. Talk with your health care provider to find out if you are at risk for hepatitis B infection. Hepatitis C Testing is recommended for:  Everyone born from [birth year] through 1963.  Anyone with known risk factors for hepatitis C. Sexually transmitted infections (STIs)  Get screened for STIs, including gonorrhea and chlamydia, if: ? You are sexually active and are younger than 36 years of age. ? You are older than 36 years of age and your health care provider tells you that you are at risk for this type of infection. ? Your sexual activity has changed since you were last screened, and you are at increased risk for chlamydia or  gonorrhea. Ask your health care provider if you are at risk.  Ask your health care provider about whether you are at high risk for HIV. Your health care provider may recommend a prescription medicine to help prevent HIV infection. If you choose to take medicine to prevent HIV, you should first get tested for HIV. You should then be tested every 3 months for as long as you are taking the medicine. Pregnancy  If you are about to stop  having your period (premenopausal) and you may become pregnant, seek counseling before you get pregnant.  Take 400 to 800 micrograms (mcg) of folic acid every day if you become pregnant.  Ask for birth control (contraception) if you want to prevent pregnancy. Osteoporosis and menopause Osteoporosis is a disease in which the bones lose minerals and strength with aging. This can result in bone fractures. If you are 77 years old or older, or if you are at risk for osteoporosis and fractures, ask your health care provider if you should:  Be screened for bone loss.  Take a calcium or vitamin D supplement to lower your risk of fractures.  Be given hormone replacement therapy (HRT) to treat symptoms of menopause. Follow these instructions at home: Lifestyle  Do not use any products that contain nicotine or tobacco, such as cigarettes, e-cigarettes, and chewing tobacco. If you need help quitting, ask your health care provider.  Do not use street drugs.  Do not share needles.  Ask your health care provider for help if you need support or information about quitting drugs. Alcohol use  Do not drink alcohol if: ? Your health care provider tells you not to drink. ? You are pregnant, may be pregnant, or are planning to become pregnant.  If you drink alcohol: ? Limit how much you use to 0-1 drink a day. ? Limit intake if you are breastfeeding.  Be aware of how much alcohol is in your drink. In the [place] one drink equals one 12 oz bottle of beer (355 mL), one 5 oz glass of wine (148 mL), or one 1 oz glass of hard liquor (44 mL). General instructions  Schedule regular health, dental, and eye exams.  Stay current with your vaccines.  Tell your health care provider if: ? You often feel depressed. ? You have ever been abused or do not feel safe at home. Summary  Adopting a healthy lifestyle and getting preventive care are important in promoting health and wellness.  Follow your health  care provider's instructions about healthy diet, exercising, and getting tested or screened for diseases.  Follow your health care provider's instructions on monitoring your cholesterol and blood pressure. This information is not intended to replace advice given to you by your health care provider. Make sure you discuss any questions you have with your health care provider. Document Released: 02/03/2011 Document Revised: 07/14/2018 Document Reviewed: 07/14/2018 Elsevier Patient Education  2020 [employer]   Patellofemoral Pain Syndrome  Patellofemoral pain syndrome is a condition in which the tissue (cartilage) on the underside of the kneecap (patella) softens or breaks down. This causes pain in the front of the knee. The condition is also called runner's knee or chondromalacia patella. Patellofemoral pain syndrome is most common in young adults who are active in sports. The knee is the largest joint in the body. The patella covers the front of the knee and is attached to muscles above and below the knee. The underside of the patella is covered with a smooth type  of cartilage (synovium). The smooth surface helps the patella to glide easily when you move your knee. Patellofemoral pain syndrome causes swelling in the joint linings and bone surfaces in the knee. What are the causes? This condition may be caused by:  Overuse of the knee.  Poor alignment of your knee joints.  Weak leg muscles.  A direct blow to your kneecap. What increases the risk? You are more likely to develop this condition if:  You do a lot of activities that can wear down your kneecap. These include: ? Running. ? Squatting. ? Climbing stairs.  You start a new physical activity or exercise program.  You wear shoes that do not fit well.  You do not have good leg strength.  You are overweight. What are the signs or symptoms? The main symptom of this condition is knee pain. This may feel like a dull, aching pain  underneath your patella, in the front of your knee. There may be a popping or cracking sound when you move your knee. Pain may get worse with:  Exercise.  Climbing stairs.  Running.  Jumping.  Squatting.  Kneeling.  Sitting for a long time.  Moving or pushing on your patella. How is this diagnosed? This condition may be diagnosed based on:  Your symptoms and medical history. You may be asked about your recent physical activities and which ones cause knee pain.  A physical exam. This may include: ? Moving your patella back and forth. ? Checking your range of knee motion. ? Having you squat or jump to see if you have pain. ? Checking the strength of your leg muscles.  Imaging tests to confirm the diagnosis. These may include an MRI of your knee. How is this treated? This condition may be treated at home with rest, ice, compression, and elevation (RICE).  Other treatments may include:  Nonsteroidal anti-inflammatory drugs (NSAIDs).  Physical therapy to stretch and strengthen your leg muscles.  Shoe inserts (orthotics) to take stress off your knee.  A knee brace or knee support.  Adhesive tapes to the skin.  Surgery to remove damaged cartilage or move the patella to a better position. This is rare. Follow these instructions at home: If you have a shoe or brace:  Wear the shoe or brace as told by your health care provider. Remove it only as told by your health care provider.  Loosen the shoe or brace if your toes tingle, become numb, or turn cold and blue.  Keep the shoe or brace clean.  If the shoe or brace is not waterproof: ? Do not let it get wet. ? Cover it with a watertight covering when you take a bath or a shower. Managing pain, stiffness, and swelling  If directed, put ice on the painful area. ? If you have a removable shoe or brace, remove it as told by your health care provider. ? Put ice in a plastic bag. ? Place a towel between your skin and the  bag. ? Leave the ice on for 20 minutes, 2-3 times a day.  Move your toes often to avoid stiffness and to lessen swelling.  Rest your knee: ? Avoid activities that cause knee pain. ? When sitting or lying down, raise (elevate) the injured area above the level of your heart, whenever possible. General instructions  Take over-the-counter and prescription medicines only as told by your health care provider.  Use splints, braces, knee supports, or walking aids as directed by your health care provider.  Perform stretching and strengthening exercises as told by your health care provider or physical therapist.  Do not use any products that contain nicotine or tobacco, such as cigarettes and e-cigarettes. These can delay healing. If you need help quitting, ask your health care provider.  Return to your normal activities as told by your health care provider. Ask your health care provider what activities are safe for you.  Keep all follow-up visits as told by your health care provider. This is important. Contact a health care provider if:  Your symptoms get worse.  You are not improving with home care. Summary  Patellofemoral pain syndrome is a condition in which the tissue (cartilage) on the underside of the kneecap (patella) softens or breaks down.  This condition causes swelling in the joint linings and bone surfaces in the knee. This leads to pain in the front of the knee.  This condition may be treated at home with rest, ice, compression, and elevation (RICE).  Use splints, braces, knee supports, or walking aids as directed by your health care provider. This information is not intended to replace advice given to you by your health care provider. Make sure you discuss any questions you have with your health care provider. Document Released: 07/09/2009 Document Revised: 08/31/2017 Document Reviewed: 08/31/2017 Elsevier Patient Education  2020 [employer]   Quadriceps Strain Rehab  Ask your health care provider which exercises are safe for you. Do exercises exactly as told by your health care provider and adjust them as directed. It is normal to feel mild stretching, pulling, tightness, or discomfort as you do these exercises. Stop right away if you feel sudden pain or your pain gets worse. Do not begin these exercises until told by your health care provider. Stretching and range-of-motion exercises These exercises warm up your muscles and joints and improve the movement and flexibility of your thigh. These exercises can also help to relieve stiffness or swelling. Heel slides  1. Lie on your back with both legs straight. If this causes back discomfort, bend the knee of your healthy leg so your foot is flat on the floor. 2. Slowly slide your left / right heel back toward your buttocks. Stop when you feel a gentle stretch in the front of your knee or thigh (quadriceps). 3. Hold this position for __________ seconds. 4. Slowly slide your left / right heel back to the starting position. Repeat __________ times. Complete this exercise __________ times a day. Quadriceps stretch, prone  1. Lie on your abdomen on a firm surface, such as a bed or padded floor (prone position). 2. Bend your left / right knee and hold your ankle. If you cannot reach your ankle or pant leg, loop a belt around your foot and grab the belt instead. 3. Gently pull your heel toward your buttocks. Your knee should not slide out to the side. You should feel a stretch in the front of your thigh and knee (quadriceps). 4. Hold this position for __________ seconds. Repeat __________ times. Complete this exercise __________ times a day. Strengthening exercises These exercises build strength and endurance in your thigh. Endurance is the ability to use your muscles for a long time, even after your muscles get tired. Straight leg raises, supine This exercise stretches the muscles in front of your thigh (quadriceps)  and the muscles that move your hips (hip flexors). Quality counts! Watch for signs that the quadriceps muscle is working to ensure that you are strengthening the correct muscles and not cheating  by using healthier muscles. 1. Lie on your back (supine position) with your left / right leg extended and your other knee bent. 2. Tense the muscles in the front of your left / right thigh. You should see your kneecap slide up or see increased dimpling just above the knee. 3. Tighten these muscles even more and raise your leg 4-6 inches (10-15 cm) off the floor. 4. Hold this position for __________ seconds. 5. Keep the thigh muscles tense as you lower your leg. 6. Relax the muscles slowly and completely after each repetition. Repeat __________ times. Complete this exercise __________ times a day. Leg raises, prone This exercise strengthens the muscles that move the hips (hip extensors). 1. Lie on your abdomen on a bed or a firm surface (prone position). Place a pillow under your hips. 2. Bend your left / right knee so your foot is straight up in the air. 3. Squeeze your buttocks muscles and lift your left / right thigh off the bed. Do not let your back arch. 4. Hold this position for __________ seconds. 5. Slowly return to the starting position. Let your muscles relax completely before doing another repetition. Repeat __________ times. Complete this exercise __________ times a day. Wall sits Follow the directions for form closely. Knee pain can occur if your feet or knees are not placed properly. 1. Lean your back against a smooth wall or door, and walk your feet out 18-24 inches (46-61 cm) from it. 2. Place your feet hip-width apart. 3. Slowly slide down the wall or door until your knees bend __________ degrees. Keep your weight back and over your heels, not over your toes. Keep your thighs straight or pointing slightly outward. 4. Hold this position for __________ seconds. 5. Use your thigh and  buttocks muscles to push yourself back up to a standing position. Keep your weight through your heels while you do this. 6. Rest for __________ seconds after each repetition. Repeat __________ times. Complete this exercise __________ times a day. This information is not intended to replace advice given to you by your health care provider. Make sure you discuss any questions you have with your health care provider. Document Released: 07/21/2005 Document Revised: 11/12/2018 Document Reviewed: 05/13/2018 Elsevier Patient Education  2020 [employer].

## 2019-07-08 LAB — CYTOLOGY - PAP
Comment: NEGATIVE
Diagnosis: NEGATIVE
High risk HPV: NEGATIVE

## 2019-07-08 LAB — HIV ANTIBODY (ROUTINE TESTING W REFLEX): HIV 1&2 Ab, 4th Generation: NONREACTIVE

## 2019-07-11 ENCOUNTER — Telehealth: Payer: Self-pay | Admitting: Family Medicine

## 2019-07-11 NOTE — Telephone Encounter (Signed)
Called pt about message sent in:  Appointment Request From: Fayrene Helper    With Provider: Letta Median, DO [[facility]]    Preferred Date Range: Any    Preferred Times: Any Time    Reason for visit: Request an Appointment    Comments:  Need to get another covid test.    Left message

## 2019-07-13 ENCOUNTER — Encounter: Payer: Self-pay | Admitting: Family Medicine

## 2019-07-13 ENCOUNTER — Other Ambulatory Visit: Payer: Self-pay | Admitting: Family Medicine

## 2019-07-13 DIAGNOSIS — E559 Vitamin D deficiency, unspecified: Secondary | ICD-10-CM

## 2019-07-13 MED ORDER — VITAMIN D (ERGOCALCIFEROL) 1.25 MG (50000 UNIT) PO CAPS: 50000.0000 [IU] | ORAL_CAPSULE | ORAL | 3 refills | Status: DC

## 2019-07-14 ENCOUNTER — Other Ambulatory Visit: Payer: Self-pay

## 2019-07-14 DIAGNOSIS — Z20822 Contact with and (suspected) exposure to covid-19: Secondary | ICD-10-CM

## 2019-07-16 LAB — NOVEL CORONAVIRUS, NAA: SARS-CoV-2, NAA: NOT DETECTED

## 2019-10-20 NOTE — Patient Instructions (Addendum)
There are no preventive care reminders to display for this patient.  Depression screen [facility] 2/9 07/07/2019 04/27/2019  Decreased Interest 1 0  Down, Depressed, Hopeless 1 3  PHQ - 2 Score 2 3  Altered sleeping 0 2  Tired, decreased energy 2 2  Change in appetite 1 1  Feeling bad or failure about yourself  0 3  Trouble concentrating 1 2  Moving slowly or fidgety/restless 1 1  Suicidal thoughts 0 0  PHQ-9 Score 7 14  Some encounter information is confidential and restricted. Go to Review Flowsheets activity to see all data.   Wear knee sleeve at work for support stability Ice/heat Xray today.    Plantar Fasciitis  Plantar fasciitis is a painful foot condition that affects the heel. It occurs when the band of tissue that connects the toes to the heel bone (plantar fascia) becomes irritated. This can happen as the result of exercising too much or doing other repetitive activities (overuse injury). The pain from plantar fasciitis can range from mild irritation to severe pain that makes it difficult to walk or move. The pain is usually worse in the morning after sleeping, or after sitting or lying down for a while. Pain may also be worse after long periods of walking or standing. What are the causes? This condition may be caused by:  Standing for long periods of time.  Wearing shoes that do not have good arch support.  Doing activities that put stress on joints (high-impact activities), including running, aerobics, and ballet.  Being overweight.  An abnormal way of walking (gait).  Tight muscles in the back of your lower leg (calf).  High arches in your feet.  Starting a new athletic activity. What are the signs or symptoms? The main symptom of this condition is heel pain. Pain may:  Be worse with first steps after a time of rest, especially in the morning after sleeping or after you have been sitting or lying down for a while.  Be worse after long periods of standing  still.  Decrease after 30-45 minutes of activity, such as gentle walking. How is this diagnosed? This condition may be diagnosed based on your medical history and your symptoms. Your health care provider may ask questions about your activity level. Your health care provider will do a physical exam to check for:  A tender area on the bottom of your foot.  A high arch in your foot.  Pain when you move your foot.  Difficulty moving your foot. You may have imaging tests to confirm the diagnosis, such as:  X-rays.  Ultrasound.  MRI. How is this treated? Treatment for plantar fasciitis depends on how severe your condition is. Treatment may include:  Rest, ice, applying pressure (compression), and raising the affected foot (elevation). This may be called RICE therapy. Your health care provider may recommend RICE therapy along with over-the-counter pain medicines to manage your pain.  Exercises to stretch your calves and your plantar fascia.  A splint that holds your foot in a stretched, upward position while you sleep (night splint).  Physical therapy to relieve symptoms and prevent problems in the future.  Injections of steroid medicine (cortisone) to relieve pain and inflammation.  Stimulating your plantar fascia with electrical impulses (extracorporeal shock wave therapy). This is usually the last treatment option before surgery.  Surgery, if other treatments have not worked after 12 months. Follow these instructions at home:  Managing pain, stiffness, and swelling  If directed, put ice on  the painful area: ? Put ice in a plastic bag, or use a frozen bottle of water. ? Place a towel between your skin and the bag or bottle. ? Roll the bottom of your foot over the bag or bottle. ? Do this for 20 minutes, 2-3 times a day.  Wear athletic shoes that have air-sole or gel-sole cushions, or try wearing soft shoe inserts that are designed for plantar fasciitis.  Raise (elevate)  your foot above the level of your heart while you are sitting or lying down. Activity  Avoid activities that cause pain. Ask your health care provider what activities are safe for you.  Do physical therapy exercises and stretches as told by your health care provider.  Try activities and forms of exercise that are easier on your joints (low-impact). Examples include swimming, water aerobics, and biking. General instructions  Take over-the-counter and prescription medicines only as told by your health care provider.  Wear a night splint while sleeping, if told by your health care provider. Loosen the splint if your toes tingle, become numb, or turn cold and blue.  Maintain a healthy weight, or work with your health care provider to lose weight as needed.  Keep all follow-up visits as told by your health care provider. This is important. Contact a health care provider if you:  Have symptoms that do not go away after caring for yourself at home.  Have pain that gets worse.  Have pain that affects your ability to move or do your daily activities. Summary  Plantar fasciitis is a painful foot condition that affects the heel. It occurs when the band of tissue that connects the toes to the heel bone (plantar fascia) becomes irritated.  The main symptom of this condition is heel pain that may be worse after exercising too much or standing still for a long time.  Treatment varies, but it usually starts with rest, ice, compression, and elevation (RICE therapy) and over-the-counter medicines to manage pain. This information is not intended to replace advice given to you by your health care provider. Make sure you discuss any questions you have with your health care provider. Document Revised: 07/03/2017 Document Reviewed: 05/18/2017 Elsevier Patient Education  2020 [employer]   Plantar Fasciitis Rehab Ask your health care provider which exercises are safe for you. Do exercises exactly as  told by your health care provider and adjust them as directed. It is normal to feel mild stretching, pulling, tightness, or discomfort as you do these exercises. Stop right away if you feel sudden pain or your pain gets worse. Do not begin these exercises until told by your health care provider. Stretching and range-of-motion exercises These exercises warm up your muscles and joints and improve the movement and flexibility of your foot. These exercises also help to relieve pain. Plantar fascia stretch  1. Sit with your left / right leg crossed over your opposite knee. 2. Hold your heel with one hand with that thumb near your arch. With your other hand, hold your toes and gently pull them back toward the top of your foot. You should feel a stretch on the bottom of your toes or your foot (plantar fascia) or both. 3. Hold this stretch for__________ seconds. 4. Slowly release your toes and return to the starting position. Repeat __________ times. Complete this exercise __________ times a day. Gastrocnemius stretch, standing This exercise is also called a calf (gastroc) stretch. It stretches the muscles in the back of the upper calf. 1.  Stand with your hands against a wall. 2. Extend your left / right leg behind you, and bend your front knee slightly. 3. Keeping your heels on the floor and your back knee straight, shift your weight toward the wall. Do not arch your back. You should feel a gentle stretch in your upper left / right calf. 4. Hold this position for __________ seconds. Repeat __________ times. Complete this exercise __________ times a day. Soleus stretch, standing This exercise is also called a calf (soleus) stretch. It stretches the muscles in the back of the lower calf. 1. Stand with your hands against a wall. 2. Extend your left / right leg behind you, and bend your front knee slightly. 3. Keeping your heels on the floor, bend your back knee and shift your weight slightly over your  back leg. You should feel a gentle stretch deep in your lower calf. 4. Hold this position for __________ seconds. Repeat __________ times. Complete this exercise __________ times a day. Gastroc and soleus stretch, standing step This exercise stretches the muscles in the back of the lower leg. These muscles are in the upper calf (gastrocnemius) and the lower calf (soleus). 1. Stand with the ball of your left / right foot on a step. The ball of your foot is on the walking surface, right under your toes. 2. Keep your other foot firmly on the same step. 3. Hold on to the wall or a railing for balance. 4. Slowly lift your other foot, allowing your body weight to press your left / right heel down over the edge of the step. You should feel a stretch in your left / right calf. 5. Hold this position for __________ seconds. 6. Return both feet to the step. 7. Repeat this exercise with a slight bend in your left / right knee. Repeat __________ times with your left / right knee straight and __________ times with your left / right knee bent. Complete this exercise __________ times a day. Balance exercise This exercise builds your balance and strength control of your arch to help take pressure off your plantar fascia. Single leg stand If this exercise is too easy, you can try it with your eyes closed or while standing on a pillow. 1. Without shoes, stand near a railing or in a doorway. You may hold on to the railing or door frame as needed. 2. Stand on your left / right foot. Keep your big toe down on the floor and try to keep your arch lifted. Do not let your foot roll inward. 3. Hold this position for __________ seconds. Repeat __________ times. Complete this exercise __________ times a day. This information is not intended to replace advice given to you by your health care provider. Make sure you discuss any questions you have with your health care provider. Document Revised: 11/11/2018 Document Reviewed:  05/19/2018 Elsevier Patient Education  [place].

## 2019-10-21 ENCOUNTER — Ambulatory Visit: Payer: BC Managed Care – PPO | Admitting: Family Medicine

## 2019-10-21 ENCOUNTER — Encounter: Payer: Self-pay | Admitting: Family Medicine

## 2019-10-21 ENCOUNTER — Telehealth: Payer: Self-pay | Admitting: Family Medicine

## 2019-10-21 ENCOUNTER — Ambulatory Visit (INDEPENDENT_AMBULATORY_CARE_PROVIDER_SITE_OTHER): Payer: BC Managed Care – PPO

## 2019-10-21 ENCOUNTER — Other Ambulatory Visit: Payer: Self-pay

## 2019-10-21 VITALS — BP 110/80 | HR 96 | Temp 97.6°F | Ht 61.5 in | Wt 215.2 lb

## 2019-10-21 DIAGNOSIS — M25562 Pain in left knee: Secondary | ICD-10-CM

## 2019-10-21 DIAGNOSIS — G8929 Other chronic pain: Secondary | ICD-10-CM

## 2019-10-21 DIAGNOSIS — L732 Hidradenitis suppurativa: Secondary | ICD-10-CM

## 2019-10-21 DIAGNOSIS — M722 Plantar fascial fibromatosis: Secondary | ICD-10-CM

## 2019-10-21 DIAGNOSIS — M25462 Effusion, left knee: Secondary | ICD-10-CM | POA: Diagnosis not present

## 2019-10-21 MED ORDER — METHYLPREDNISOLONE 4 MG PO TBPK: ORAL_TABLET | ORAL | 0 refills | Status: DC

## 2019-10-21 NOTE — Telephone Encounter (Signed)
Pt had an appt today and got a note for work and asked if we could fax the note to her work, She confirmed that it was a Hydrologist number and that the number is [phone].
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
## 2019-10-24 ENCOUNTER — Ambulatory Visit: Payer: BC Managed Care – PPO | Admitting: Physician Assistant

## 2019-11-23 ENCOUNTER — Other Ambulatory Visit: Payer: Self-pay

## 2019-11-24 ENCOUNTER — Ambulatory Visit: Payer: BC Managed Care – PPO | Admitting: Family Medicine

## 2019-11-24 ENCOUNTER — Other Ambulatory Visit: Payer: Self-pay | Admitting: Family Medicine

## 2019-11-24 DIAGNOSIS — G8929 Other chronic pain: Secondary | ICD-10-CM

## 2019-11-24 DIAGNOSIS — M25562 Pain in left knee: Secondary | ICD-10-CM

## 2019-12-02 ENCOUNTER — Ambulatory Visit: Payer: BC Managed Care – PPO | Admitting: Physician Assistant

## 2019-12-09 ENCOUNTER — Ambulatory Visit: Payer: BC Managed Care – PPO | Admitting: Orthopaedic Surgery

## 2021-01-02 ENCOUNTER — Other Ambulatory Visit: Payer: Self-pay

## 2021-01-02 ENCOUNTER — Emergency Department (HOSPITAL_COMMUNITY)
Admission: EM | Admit: 2021-01-02 | Discharge: 2021-01-03 | Disposition: A | Discharge status: Home / Self Care | Payer: Self-pay | Attending: Emergency Medicine | Admitting: Emergency Medicine

## 2021-01-02 DIAGNOSIS — M545 Low back pain, unspecified: Secondary | ICD-10-CM | POA: Insufficient documentation

## 2021-01-02 DIAGNOSIS — Z87891 Personal history of nicotine dependence: Secondary | ICD-10-CM | POA: Insufficient documentation

## 2021-01-02 DIAGNOSIS — J45909 Unspecified asthma, uncomplicated: Secondary | ICD-10-CM | POA: Insufficient documentation

## 2021-01-02 DIAGNOSIS — R109 Unspecified abdominal pain: Secondary | ICD-10-CM | POA: Insufficient documentation

## 2021-01-02 DIAGNOSIS — Z91010 Allergy to peanuts: Secondary | ICD-10-CM | POA: Insufficient documentation
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
## 2021-01-03 ENCOUNTER — Emergency Department (HOSPITAL_COMMUNITY): Payer: Self-pay

## 2021-01-03 LAB — URINALYSIS, ROUTINE W REFLEX MICROSCOPIC
Bacteria, UA: NONE SEEN
Bilirubin Urine: NEGATIVE
Glucose, UA: NEGATIVE mg/dL
Hgb urine dipstick: NEGATIVE
Ketones, ur: 5 mg/dL — AB
Nitrite: NEGATIVE
Protein, ur: 30 mg/dL — AB
Specific Gravity, Urine: 1.027 (ref 1.005–1.030)
pH: 5 (ref 5.0–8.0)

## 2021-01-03 LAB — BASIC METABOLIC PANEL
Anion gap: 10 (ref 5–15)
BUN: 10 mg/dL (ref 6–20)
CO2: 22 mmol/L (ref 22–32)
Calcium: 9.1 mg/dL (ref 8.9–10.3)
Chloride: 106 mmol/L (ref 98–111)
Creatinine, Ser: 0.86 mg/dL (ref 0.44–1.00)
GFR, Estimated: >60 mL/min (ref >60)
Glucose, Bld: 94 mg/dL (ref 70–99)
Potassium: 3.5 mmol/L (ref 3.5–5.1)
Sodium: 138 mmol/L (ref 135–145)

## 2021-01-03 LAB — CBC
HCT: 35.8 % — ABNORMAL LOW (ref 36.0–46.0)
Hemoglobin: 11.6 g/dL — ABNORMAL LOW (ref 12.0–15.0)
MCH: 27.2 pg (ref 26.0–34.0)
MCHC: 32.4 g/dL (ref 30.0–36.0)
MCV: 84 fL (ref 80.0–100.0)
Platelets: 185 10*3/uL (ref 150–400)
RBC: 4.26 MIL/uL (ref 3.87–5.11)
RDW: 14 % (ref 11.5–15.5)
WBC: 8.3 10*3/uL (ref 4.0–10.5)
nRBC: 0 % (ref 0.0–0.2)

## 2021-01-03 LAB — I-STAT BETA HCG BLOOD, ED (MC, WL, AP ONLY): I-stat hCG, quantitative: <5 m[IU]/mL (ref <5)

## 2021-01-03 MED ORDER — IBUPROFEN 800 MG PO TABS: 800.0000 mg | ORAL_TABLET | Freq: Three times a day (TID) | ORAL | 0 refills | Status: DC

## 2021-01-03 MED ORDER — HYDROCODONE-ACETAMINOPHEN 5-325 MG PO TABS: 1.0000 | ORAL_TABLET | ORAL | 0 refills | Status: DC | PRN

## 2021-01-03 MED ORDER — OXYCODONE-ACETAMINOPHEN 5-325 MG PO TABS
1.0000 | ORAL_TABLET | Freq: Once | ORAL | Status: AC
  Administered 2021-01-03: 1 via ORAL
  Filled 2021-01-03: qty 1

## 2021-01-03 MED ORDER — METHOCARBAMOL 500 MG PO TABS: 500.0000 mg | ORAL_TABLET | Freq: Three times a day (TID) | ORAL | 0 refills | Status: DC | PRN

## 2021-01-03 MED ORDER — IBUPROFEN 800 MG PO TABS
800.0000 mg | ORAL_TABLET | Freq: Once | ORAL | Status: AC
  Administered 2021-01-03: 800 mg via ORAL
  Filled 2021-01-03: qty 1

## 2021-01-03 NOTE — ED Notes (Signed)
Patient ambulated to the bedside commode for urine sample. Patient appears to be in pain while walking. Patient states her pain is on the left flank area and radiates up her side.
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
## 2021-01-03 NOTE — Discharge Instructions (Addendum)
Your work-up has been reassuring.  The pain that you are experiencing is coming from your back.  This will likely get better on its own, we will treat with pain medication.  There were some changes noted in your uterus when the radiologist looked at your CAT scan.  This is not related to your pain today.  You need to follow-up with OB/GYN for outpatient ultrasound to further investigate this. Please call [facility] (listed above)

## 2021-02-19 ENCOUNTER — Emergency Department (HOSPITAL_COMMUNITY)
Admission: EM | Admit: 2021-02-19 | Discharge: 2021-02-19 | Disposition: A | Discharge status: Home / Self Care | Payer: Self-pay | Attending: Emergency Medicine | Admitting: Emergency Medicine

## 2021-02-19 ENCOUNTER — Encounter (HOSPITAL_COMMUNITY): Payer: Self-pay

## 2021-02-19 ENCOUNTER — Other Ambulatory Visit: Payer: Self-pay

## 2021-02-19 DIAGNOSIS — Z87891 Personal history of nicotine dependence: Secondary | ICD-10-CM | POA: Insufficient documentation

## 2021-02-19 DIAGNOSIS — Z91010 Allergy to peanuts: Secondary | ICD-10-CM | POA: Insufficient documentation

## 2021-02-19 DIAGNOSIS — M25552 Pain in left hip: Secondary | ICD-10-CM | POA: Insufficient documentation

## 2021-02-19 DIAGNOSIS — J45909 Unspecified asthma, uncomplicated: Secondary | ICD-10-CM | POA: Insufficient documentation

## 2021-02-19 DIAGNOSIS — R3589 Other polyuria: Secondary | ICD-10-CM | POA: Insufficient documentation

## 2021-02-19 LAB — URINALYSIS, ROUTINE W REFLEX MICROSCOPIC
Bilirubin Urine: NEGATIVE
Glucose, UA: NEGATIVE mg/dL
Hgb urine dipstick: NEGATIVE
Ketones, ur: NEGATIVE mg/dL
Leukocytes,Ua: NEGATIVE
Nitrite: NEGATIVE
Protein, ur: NEGATIVE mg/dL
Specific Gravity, Urine: 1.023 (ref 1.005–1.030)
pH: 6 (ref 5.0–8.0)

## 2021-02-19 LAB — PREGNANCY, URINE: Preg Test, Ur: NEGATIVE

## 2021-02-19 MED ORDER — METHYL SALICYLATE-LIDO-MENTHOL 4-4-5 % EX PTCH: 1.0000 | MEDICATED_PATCH | Freq: Two times a day (BID) | CUTANEOUS | 0 refills | Status: DC

## 2021-02-19 MED ORDER — GABAPENTIN 300 MG PO CAPS
300.0000 mg | ORAL_CAPSULE | Freq: Once | ORAL | Status: AC
  Administered 2021-02-19: 300 mg via ORAL
  Filled 2021-02-19: qty 1

## 2021-02-19 MED ORDER — TIZANIDINE HCL 4 MG PO TABS
2.0000 mg | ORAL_TABLET | Freq: Once | ORAL | Status: AC
  Administered 2021-02-19: 2 mg via ORAL
  Filled 2021-02-19: qty 1

## 2021-02-19 MED ORDER — GABAPENTIN 300 MG PO CAPS: 300.0000 mg | ORAL_CAPSULE | Freq: Three times a day (TID) | ORAL | 0 refills | Status: DC

## 2021-02-19 MED ORDER — IBUPROFEN 200 MG PO TABS
400.0000 mg | ORAL_TABLET | Freq: Once | ORAL | Status: AC
  Administered 2021-02-19: 400 mg via ORAL
  Filled 2021-02-19: qty 2

## 2021-02-19 MED ORDER — IBUPROFEN 400 MG PO TABS: 400.0000 mg | ORAL_TABLET | Freq: Three times a day (TID) | ORAL | 0 refills | Status: AC

## 2021-02-19 MED ORDER — DICLOFENAC EPOLAMINE 1.3 % EX PTCH
1.0000 | MEDICATED_PATCH | Freq: Two times a day (BID) | CUTANEOUS | Status: DC
  Administered 2021-02-19: 1 via TRANSDERMAL
  Filled 2021-02-19: qty 1

## 2021-02-19 NOTE — ED Triage Notes (Signed)
Patient c/o left hip pain x 1 month. Patient states the pain has worsened. 

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
## 2021-02-19 NOTE — Discharge Instructions (Addendum)
As discussed, your ongoing low back and hip pain is likely due to lumbosacral radiculopathy or irritation of the nerves in your lower back as they control your leg.  Please take all medication as directed and schedule follow-up with our orthopedic colleagues. In addition, follow-up with your primary care physician to ensure appropriate ongoing comprehensive care.  Return here for concerning changes in your condition.

## 2021-06-28 ENCOUNTER — Emergency Department (HOSPITAL_COMMUNITY)
Admission: EM | Admit: 2021-06-28 | Discharge: 2021-06-28 | Disposition: A | Discharge status: Home / Self Care | Payer: Self-pay | Attending: Emergency Medicine | Admitting: Emergency Medicine

## 2021-06-28 ENCOUNTER — Other Ambulatory Visit: Payer: Self-pay

## 2021-06-28 ENCOUNTER — Emergency Department (HOSPITAL_COMMUNITY): Payer: Self-pay

## 2021-06-28 ENCOUNTER — Encounter (HOSPITAL_COMMUNITY): Payer: Self-pay | Admitting: Emergency Medicine

## 2021-06-28 DIAGNOSIS — Z91010 Allergy to peanuts: Secondary | ICD-10-CM | POA: Insufficient documentation

## 2021-06-28 DIAGNOSIS — M25571 Pain in right ankle and joints of right foot: Secondary | ICD-10-CM | POA: Insufficient documentation

## 2021-06-28 DIAGNOSIS — J45909 Unspecified asthma, uncomplicated: Secondary | ICD-10-CM | POA: Insufficient documentation

## 2021-06-28 DIAGNOSIS — Z87891 Personal history of nicotine dependence: Secondary | ICD-10-CM | POA: Insufficient documentation

## 2021-06-28 DIAGNOSIS — G8929 Other chronic pain: Secondary | ICD-10-CM | POA: Insufficient documentation

## 2021-06-28 LAB — POC URINE PREG, ED: Preg Test, Ur: NEGATIVE

## 2021-06-28 MED ORDER — MELOXICAM 7.5 MG PO TABS: 7.5000 mg | ORAL_TABLET | Freq: Every day | ORAL | 0 refills | Status: DC

## 2021-06-28 NOTE — Discharge Instructions (Addendum)
Follow-up with orthopedics.  Take the anti-inflammatory as prescribed.  If you develop any epigastric abdominal pain, bloody stools stop taking his medication, seek reevaluation you may wear this boot as needed for comfort.  Return for new or worsening symptoms

## 2021-06-28 NOTE — ED Notes (Signed)
An After Visit Summary was printed and given to the patient. Discharge instructions given and no further questions at this time.  

## 2021-06-28 NOTE — ED Triage Notes (Signed)
Reports right ankle pain x1 month. Denies any known injury. Reports no relief with tylenol.
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
## 2021-06-28 NOTE — Progress Notes (Signed)
Orthopedic Tech Progress Note Patient Details:  Alicia Cannon July 21, 1981 [id number]  Ortho Devices Type of Ortho Device: CAM walker, Crutches Ortho Device/Splint Location: Right foot Ortho Device/Splint Interventions: Application   Post Interventions Patient Tolerated: Well  [name] 06/28/2021, 8:50 PM

## 2021-12-13 ENCOUNTER — Encounter: Payer: Self-pay | Admitting: Nurse Practitioner

## 2021-12-13 ENCOUNTER — Ambulatory Visit (INDEPENDENT_AMBULATORY_CARE_PROVIDER_SITE_OTHER): Payer: Commercial Managed Care - HMO | Admitting: Nurse Practitioner

## 2021-12-13 ENCOUNTER — Other Ambulatory Visit (HOSPITAL_COMMUNITY)
Admission: RE | Admit: 2021-12-13 | Discharge: 2021-12-13 | Disposition: A | Discharge status: Home / Self Care | Payer: Commercial Managed Care - HMO | Source: Ambulatory Visit | Attending: Nurse Practitioner | Admitting: Nurse Practitioner

## 2021-12-13 VITALS — BP 132/90 | HR 106 | Temp 97.2°F | Ht 61.5 in | Wt 199.2 lb

## 2021-12-13 DIAGNOSIS — Z113 Encounter for screening for infections with a predominantly sexual mode of transmission: Secondary | ICD-10-CM | POA: Insufficient documentation

## 2021-12-13 DIAGNOSIS — N979 Female infertility, unspecified: Secondary | ICD-10-CM

## 2021-12-13 DIAGNOSIS — L732 Hidradenitis suppurativa: Secondary | ICD-10-CM | POA: Diagnosis not present

## 2021-12-13 DIAGNOSIS — J4520 Mild intermittent asthma, uncomplicated: Secondary | ICD-10-CM

## 2021-12-13 DIAGNOSIS — M7662 Achilles tendinitis, left leg: Secondary | ICD-10-CM

## 2021-12-13 DIAGNOSIS — Z13220 Encounter for screening for lipoid disorders: Secondary | ICD-10-CM

## 2021-12-13 DIAGNOSIS — R030 Elevated blood-pressure reading, without diagnosis of hypertension: Secondary | ICD-10-CM

## 2021-12-13 DIAGNOSIS — Z136 Encounter for screening for cardiovascular disorders: Secondary | ICD-10-CM

## 2021-12-13 DIAGNOSIS — M7661 Achilles tendinitis, right leg: Secondary | ICD-10-CM | POA: Insufficient documentation
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
## 2021-12-13 NOTE — Assessment & Plan Note (Signed)
Blood pressure slightly elevated today at 132/90.  Encouraged her to start checking her blood pressure daily at home and writing it down.  Also discussed limiting salt in her foods.  Follow-up in 3 months. ?

## 2021-12-13 NOTE — Patient Instructions (Signed)
It was great to see you! ? ?I am placing a referral to dermatology ? ?You can take ibuprofen and use ice as needed for your ankles. Do the attached stretches daily.  ? ?Start checking your blood pressure at home daily and writing it down ? ?Let's follow-up in 3 months, sooner if you have concerns. ? ?If a referral was placed today, you will be contacted for an appointment. Please note that routine referrals can sometimes take up to 3-4 weeks to process. Please call our office if you haven't heard anything after this time frame. ? ?Take care, ? ?Vance Peper, NP ? ?

## 2021-12-13 NOTE — Assessment & Plan Note (Signed)
She has tenderness along the Achilles tendon and bilateral ankles.  Encouraged her to take ibuprofen as needed for pain, she can also use ice several times a day.  Stretches printed off and given to her.  Discussed that we can give a referral for podiatry for injections, however she would like to hold off on that at this point.  Follow-up if symptoms worsen or with any concerns. ?

## 2021-12-13 NOTE — Assessment & Plan Note (Signed)
Chronic, stable.  She states that she rarely needs to use her albuterol inhaler.  Follow-up if symptoms worsen or with any concerns. ?

## 2021-12-13 NOTE — Assessment & Plan Note (Signed)
She notes that she has not been able to get pregnant and is not on birth control.  Her menstrual periods are regular and she has never had GYN surgery.  She states that she has been with partners who have both had kids in previous relationships.  She is interested in looking into this further.  We will check FSH, LH, TSH, CBC, CMP, testosterone, pelvic ultrasound.  Referral placed to OB/GYN. ?

## 2021-12-13 NOTE — Assessment & Plan Note (Signed)
She has recurrent abscesses under her bilateral axilla.  She currently has 1 today that is draining.  Encouraged her to use warm compresses several times a day.  Continue making sure that she keeps the area clean and dry.  Referral placed to dermatology. ?

## 2021-12-15 ENCOUNTER — Encounter: Payer: Self-pay | Admitting: Nurse Practitioner

## 2021-12-17 LAB — TSH: TSH: 0.93 mIU/L

## 2021-12-17 LAB — CBC WITH DIFFERENTIAL/PLATELET
Absolute Monocytes: 369 cells/uL (ref 200–950)
Basophils Absolute: 28 cells/uL (ref 0–200)
Basophils Relative: 0.4 %
Eosinophils Absolute: 121 cells/uL (ref 15–500)
Eosinophils Relative: 1.7 %
HCT: 38.5 % (ref 35.0–45.0)
Hemoglobin: 12.9 g/dL (ref 11.7–15.5)
Lymphs Abs: 2386 cells/uL (ref 850–3900)
MCH: 27.2 pg (ref 27.0–33.0)
MCHC: 33.5 g/dL (ref 32.0–36.0)
MCV: 81.1 fL (ref 80.0–100.0)
MPV: 10.6 fL (ref 7.5–12.5)
Monocytes Relative: 5.2 %
Neutro Abs: 4196 cells/uL (ref 1500–7800)
Neutrophils Relative %: 59.1 %
Platelets: 242 10*3/uL (ref 140–400)
RBC: 4.75 10*6/uL (ref 3.80–5.10)
RDW: 13.7 % (ref 11.0–15.0)
Total Lymphocyte: 33.6 %
WBC: 7.1 10*3/uL (ref 3.8–10.8)

## 2021-12-17 LAB — URINE CYTOLOGY ANCILLARY ONLY
Chlamydia: NEGATIVE
Comment: NEGATIVE
Comment: NORMAL
Neisseria Gonorrhea: NEGATIVE

## 2021-12-17 LAB — COMPREHENSIVE METABOLIC PANEL
AG Ratio: 1.3 (calc) (ref 1.0–2.5)
ALT: 10 U/L (ref 6–29)
AST: 20 U/L (ref 10–30)
Albumin: 4.2 g/dL (ref 3.6–5.1)
Alkaline phosphatase (APISO): 66 U/L (ref 31–125)
BUN: 9 mg/dL (ref 7–25)
CO2: 25 mmol/L (ref 20–32)
Calcium: 9.3 mg/dL (ref 8.6–10.2)
Chloride: 104 mmol/L (ref 98–110)
Creat: 0.72 mg/dL (ref 0.50–0.97)
Globulin: 3.3 g/dL (calc) (ref 1.9–3.7)
Glucose, Bld: 88 mg/dL (ref 65–99)
Potassium: 3.8 mmol/L (ref 3.5–5.3)
Sodium: 140 mmol/L (ref 135–146)
Total Bilirubin: 0.4 mg/dL (ref 0.2–1.2)
Total Protein: 7.5 g/dL (ref 6.1–8.1)

## 2021-12-17 LAB — LIPID PANEL
Cholesterol: 213 mg/dL — ABNORMAL HIGH (ref <200)
HDL: 91 mg/dL (ref >=50)
LDL Cholesterol (Calc): 107 mg/dL (calc) — ABNORMAL HIGH
Non-HDL Cholesterol (Calc): 122 mg/dL (calc) (ref <130)
Total CHOL/HDL Ratio: 2.3 (calc) (ref <5.0)
Triglycerides: 64 mg/dL (ref <150)

## 2021-12-17 LAB — HIV ANTIBODY (ROUTINE TESTING W REFLEX): HIV 1&2 Ab, 4th Generation: NONREACTIVE

## 2021-12-17 LAB — HSV(HERPES SIMPLEX VRS) I + II AB-IGG
HAV 1 IGG,TYPE SPECIFIC AB: <0.9 index
HSV 2 IGG,TYPE SPECIFIC AB: 15 index — ABNORMAL HIGH

## 2021-12-17 LAB — FOLLICLE STIMULATING HORMONE: FSH: 5.6 m[IU]/mL

## 2021-12-17 LAB — HEPATITIS C ANTIBODY
Hepatitis C Ab: NONREACTIVE
SIGNAL TO CUT-OFF: 0.21 (ref <1.00)

## 2021-12-17 LAB — TESTOSTERONE, TOTAL, LC/MS/MS

## 2021-12-17 LAB — RPR: RPR Ser Ql: NONREACTIVE

## 2021-12-17 LAB — LUTEINIZING HORMONE: LH: 6 m[IU]/mL

## 2021-12-26 ENCOUNTER — Telehealth: Payer: Self-pay | Admitting: Nurse Practitioner

## 2021-12-26 NOTE — Telephone Encounter (Signed)
Brandy from [facility] called to ask exactly what Lauren wants to look at with the Pelvic Scan... and if Leotis Shames is interested in the # of follicles. CB: [phone], option 1 then option 5

## 2021-12-27 ENCOUNTER — Ambulatory Visit
Admission: RE | Admit: 2021-12-27 | Discharge: 2021-12-27 | Disposition: A | Discharge status: Home / Self Care | Payer: Commercial Managed Care - HMO | Source: Ambulatory Visit | Attending: Nurse Practitioner | Admitting: Nurse Practitioner

## 2021-12-27 DIAGNOSIS — N979 Female infertility, unspecified: Secondary | ICD-10-CM

## 2022-02-26 ENCOUNTER — Encounter: Payer: Commercial Managed Care - HMO | Admitting: Obstetrics and Gynecology

## 2022-03-21 ENCOUNTER — Ambulatory Visit: Payer: Commercial Managed Care - HMO | Admitting: Nurse Practitioner

## 2022-04-03 NOTE — Progress Notes (Deleted)
   Established Patient Office Visit  Subjective   Patient ID: Alicia Cannon, female    DOB: 07-18-1981  Age: 41 y.o. MRN: [id number]  No chief complaint on file.   HPI  Alicia Cannon is here to follow-up on elevated blood pressure reading.   {History (Optional):23778}  ROS    Objective:     There were no vitals taken for this visit. {Vitals History (Optional):23777}  Physical Exam   No results found for any visits on 04/04/22.  {Labs (Optional):23779}  The ASCVD Risk score (Arnett DK, et al., 2019) failed to calculate for the following reasons:   The 2019 ASCVD risk score is only valid for ages 2 to 55    Assessment & Plan:   Problem List Items Addressed This Visit   None   No follow-ups on file.    Gerre Scull, NP

## 2022-04-04 ENCOUNTER — Ambulatory Visit: Payer: Commercial Managed Care - HMO | Admitting: Nurse Practitioner

## 2022-04-04 ENCOUNTER — Telehealth: Payer: Self-pay | Admitting: Nurse Practitioner

## 2022-04-04 NOTE — Telephone Encounter (Signed)
Pt was a no show for appt with [facility] 04/04/2022, this is her first no show. Letter has been sent

## 2022-04-11 ENCOUNTER — Encounter: Payer: Self-pay | Admitting: Nurse Practitioner

## 2022-04-11 NOTE — Telephone Encounter (Signed)
Same day cancel/no show 03/21/22 No show 04/04/22  Pt has not rescheduled yet. Letter was sent to her. I am sending final warning letter via mail and MyChart today.

## 2022-04-14 NOTE — Telephone Encounter (Signed)
Noted  

## 2022-04-17 ENCOUNTER — Encounter: Payer: Commercial Managed Care - HMO | Admitting: Obstetrics and Gynecology

## 2022-04-28 ENCOUNTER — Encounter (HOSPITAL_COMMUNITY): Payer: Self-pay | Admitting: Emergency Medicine

## 2022-04-28 ENCOUNTER — Emergency Department (HOSPITAL_COMMUNITY)
Admission: EM | Admit: 2022-04-28 | Discharge: 2022-04-28 | Disposition: A | Discharge status: Home / Self Care | Payer: Commercial Managed Care - HMO | Attending: Emergency Medicine | Admitting: Emergency Medicine

## 2022-04-28 DIAGNOSIS — J45909 Unspecified asthma, uncomplicated: Secondary | ICD-10-CM | POA: Diagnosis not present

## 2022-04-28 DIAGNOSIS — Z91010 Allergy to peanuts: Secondary | ICD-10-CM | POA: Insufficient documentation

## 2022-04-28 DIAGNOSIS — M7661 Achilles tendinitis, right leg: Secondary | ICD-10-CM | POA: Diagnosis not present

## 2022-04-28 DIAGNOSIS — Z7951 Long term (current) use of inhaled steroids: Secondary | ICD-10-CM | POA: Insufficient documentation

## 2022-04-28 DIAGNOSIS — M25571 Pain in right ankle and joints of right foot: Secondary | ICD-10-CM | POA: Diagnosis present

## 2022-04-28 MED ORDER — KETOROLAC TROMETHAMINE 60 MG/2ML IM SOLN
30.0000 mg | Freq: Once | INTRAMUSCULAR | Status: AC
  Administered 2022-04-28: 30 mg via INTRAMUSCULAR
  Filled 2022-04-28: qty 2

## 2022-04-28 NOTE — ED Provider Notes (Signed)
  [facility] DEPT Provider Note   CSN: [id number] Arrival date & time: 04/28/22  1330     History {Add pertinent medical, surgical, social history, OB history to HPI:1} Chief Complaint  Patient presents with   Ankle Pain    Alicia Cannon is a 41 y.o. female.   Ankle Pain      Home Medications Prior to Admission medications   Medication Sig Start Date End Date Taking? Authorizing Provider  albuterol (PROVENTIL HFA;VENTOLIN HFA) 108 (90 Base) MCG/ACT inhaler Inhale 2 puffs into the lungs every 6 (six) hours as needed for wheezing or shortness of breath. Patient not taking: Reported on 12/13/2021 09/01/18   Ronnald Nian, DO  Multiple Vitamins-Minerals (MULTIVITAMIN GUMMIES ADULT PO) Take by mouth.    [provider]      Allergies    Peanut-containing drug products    Review of Systems   Review of Systems  Physical Exam Updated Vital Signs BP (!) 147/84   Pulse 65   Temp 98.8 F (37.1 C) (Oral)   Resp 18   LMP 04/23/2022   SpO2 99%  Physical Exam  ED Results / Procedures / Treatments   Labs (all labs ordered are listed, but only abnormal results are displayed) Labs Reviewed - No data to display  EKG None  Radiology No results found.  Procedures Procedures  {Document cardiac monitor, telemetry assessment procedure when appropriate:1}  Medications Ordered in ED Medications - No data to display  ED Course/ Medical Decision Making/ A&P                           Medical Decision Making  ***  {Document critical care time when appropriate:1} {Document review of labs and clinical decision tools ie heart score, Chads2Vasc2 etc:1}  {Document your independent review of radiology images, and any outside records:1} {Document your discussion with family members, caretakers, and with consultants:1} {Document social determinants of health affecting pt's care:1} {Document your decision making why or why not admission,  treatments were needed:1} Final Clinical Impression(s) / ED Diagnoses Final diagnoses:  None    Rx / DC Orders ED Discharge Orders     None

## 2022-04-28 NOTE — ED Triage Notes (Signed)
Patient c/o pain and swelling to right ankle for months. States seen for same with possible tendonitis. States taking ibuprofen with little relief. Denies trauma or injury. Ambulatory.

## 2022-04-29 ENCOUNTER — Telehealth: Payer: Self-pay | Admitting: Nurse Practitioner

## 2022-04-29 NOTE — Telephone Encounter (Signed)
Called and ldvm. Pt returned  phone call. Explained to pt pcp will be notified of request for prescription strength ibuprofen. Sw, cma

## 2022-04-29 NOTE — Telephone Encounter (Signed)
Pt went to emergency room yesteday right ankle and pt stated that they did not give pt pain medication. Pt want to know can you prescribe Ibuprofene

## 2022-04-30 MED ORDER — IBUPROFEN 600 MG PO TABS: 600.0000 mg | ORAL_TABLET | Freq: Three times a day (TID) | ORAL | 0 refills | Status: DC | PRN

## 2022-04-30 NOTE — Telephone Encounter (Signed)
Called and notified pt ibuprofen sen to preferred pharmacy. Pt voiced understanding

## 2022-05-14 ENCOUNTER — Ambulatory Visit: Payer: Commercial Managed Care - HMO | Admitting: Podiatry

## 2022-05-29 ENCOUNTER — Telehealth: Payer: Self-pay | Admitting: Family Medicine

## 2022-05-29 ENCOUNTER — Ambulatory Visit: Payer: Commercial Managed Care - HMO | Admitting: Family Medicine

## 2022-05-29 NOTE — Telephone Encounter (Signed)
Pt called to cancel appt for today 2:20p with Dr. Grandville Silos. Pt has prior same day/late cancel & a prior no show. Pt was advised of $50 late cancel fee.   Lauren PCP - Please advise if you want me to send final warning  or dismissal.

## 2022-06-04 ENCOUNTER — Encounter: Payer: Self-pay | Admitting: Nurse Practitioner

## 2022-06-04 NOTE — Telephone Encounter (Signed)
Generated dismissal 

## 2022-06-11 ENCOUNTER — Encounter: Payer: Commercial Managed Care - HMO | Admitting: Obstetrics and Gynecology

## 2023-01-08 ENCOUNTER — Telehealth: Payer: Self-pay | Admitting: Nurse Practitioner

## 2023-01-08 NOTE — Telephone Encounter (Signed)
Alicia Cannon called to make an appt. When finding she had been dismissed she asked for a reason and to be able to discuss the reason for her cancellations.

## 2023-01-09 NOTE — Telephone Encounter (Signed)
LVM and sent mychart msg to pt

## 2023-01-20 ENCOUNTER — Encounter (HOSPITAL_COMMUNITY): Payer: Self-pay | Admitting: Emergency Medicine

## 2023-01-20 ENCOUNTER — Emergency Department (HOSPITAL_COMMUNITY)
Admission: EM | Admit: 2023-01-20 | Discharge: 2023-01-21 | Disposition: A | Discharge status: Home / Self Care | Payer: 59 | Attending: Emergency Medicine | Admitting: Emergency Medicine

## 2023-01-20 DIAGNOSIS — L02411 Cutaneous abscess of right axilla: Secondary | ICD-10-CM | POA: Diagnosis not present

## 2023-01-20 DIAGNOSIS — L732 Hidradenitis suppurativa: Secondary | ICD-10-CM | POA: Diagnosis not present

## 2023-01-20 DIAGNOSIS — Z91010 Allergy to peanuts: Secondary | ICD-10-CM | POA: Diagnosis not present

## 2023-01-20 HISTORY — DX: Hidradenitis suppurativa: L73.2

## 2023-01-20 NOTE — ED Notes (Signed)
Called x1

## 2023-01-20 NOTE — ED Triage Notes (Signed)
Pt has 2 axillary abscesses on R side- common for her and she usually is able to take care of it at home. Today she is unable to due to them being next to each other.

## 2023-01-21 ENCOUNTER — Emergency Department (HOSPITAL_COMMUNITY)
Admission: EM | Admit: 2023-01-21 | Discharge: 2023-01-21 | Disposition: A | Discharge status: Home / Self Care | Payer: 59 | Source: Home / Self Care | Attending: Emergency Medicine | Admitting: Emergency Medicine

## 2023-01-21 ENCOUNTER — Other Ambulatory Visit: Payer: Self-pay

## 2023-01-21 ENCOUNTER — Encounter (HOSPITAL_COMMUNITY): Payer: Self-pay | Admitting: Emergency Medicine

## 2023-01-21 DIAGNOSIS — L732 Hidradenitis suppurativa: Secondary | ICD-10-CM | POA: Insufficient documentation

## 2023-01-21 DIAGNOSIS — L02411 Cutaneous abscess of right axilla: Secondary | ICD-10-CM | POA: Insufficient documentation

## 2023-01-21 DIAGNOSIS — Z91010 Allergy to peanuts: Secondary | ICD-10-CM | POA: Insufficient documentation

## 2023-01-21 MED ORDER — DOXYCYCLINE HYCLATE 100 MG PO TABS
100.0000 mg | ORAL_TABLET | Freq: Once | ORAL | Status: AC
  Administered 2023-01-21: 100 mg via ORAL
  Filled 2023-01-21: qty 1

## 2023-01-21 MED ORDER — OXYCODONE-ACETAMINOPHEN 5-325 MG PO TABS: 1.0000 | ORAL_TABLET | Freq: Four times a day (QID) | ORAL | 0 refills | Status: DC | PRN

## 2023-01-21 MED ORDER — DOXYCYCLINE HYCLATE 100 MG PO CAPS: 100.0000 mg | ORAL_CAPSULE | Freq: Two times a day (BID) | ORAL | 0 refills | Status: DC

## 2023-01-21 MED ORDER — TRAMADOL HCL 50 MG PO TABS
50.0000 mg | ORAL_TABLET | Freq: Once | ORAL | Status: AC
  Administered 2023-01-21: 50 mg via ORAL
  Filled 2023-01-21: qty 1

## 2023-01-21 MED ORDER — TRAMADOL HCL 50 MG PO TABS: 50.0000 mg | ORAL_TABLET | Freq: Four times a day (QID) | ORAL | 0 refills | Status: DC | PRN

## 2023-01-21 NOTE — Discharge Instructions (Addendum)
You were seen in the emergency department for an abscess in the right armpit.  This area appears to be highly consistent with hidradenitis suppurativa.  Drainage is typically not recommended for these as it can cause scarring and worsened outcomes.  I would advise a follow-up with the dermatology clinic or general surgery for further evaluation and treatment of your condition.  There is a hidradenitis suppurativa clinic that is available through [facility] dermatology in [place].  [address], [place], [place], [address] [phone] M-F 8am-5pm  Please reach out to this group for an appointment and evaluation.

## 2023-01-21 NOTE — ED Notes (Signed)
ED Provider at bedside. 

## 2023-01-21 NOTE — ED Provider Notes (Signed)
[place] EMERGENCY DEPARTMENT AT [facility] Provider Note   CSN: [id number] Arrival date & time: 01/21/23  1739     History Chief Complaint  Patient presents with   Abscess    Alicia Cannon is a 42 y.o. female.  Patient presents to the emergency department complaints of an abscess.  She was seen earlier today for concerns of an abscess in her right underarm.  History of hidradenitis suppurativa.  Patient has not followed up with dermatology or surgery for further evaluation of condition.  Denies taking antibiotic prescription that she was prescribed earlier today for this either.  States that she still has some drainage coming from one of the sites but states no drainage coming from any other site.  Was hoping for possible I&D today.   Abscess      Home Medications Prior to Admission medications   Medication Sig Start Date End Date Taking? Authorizing Provider  albuterol (PROVENTIL HFA;VENTOLIN HFA) 108 (90 Base) MCG/ACT inhaler Inhale 2 puffs into the lungs every 6 (six) hours as needed for wheezing or shortness of breath. 09/01/18  Yes Cirigliano, Mary K, DO  doxycycline (VIBRAMYCIN) 100 MG capsule Take 1 capsule (100 mg total) by mouth 2 (two) times daily. 01/21/23  Yes Garlon Hatchet, PA-C  ibuprofen (ADVIL) 800 MG tablet Take 800 mg by mouth every 8 (eight) hours as needed for mild pain.   Yes [provider]  ibuprofen (ADVIL) 600 MG tablet Take 1 tablet (600 mg total) by mouth every 8 (eight) hours as needed. Patient not taking: Reported on 01/21/2023 04/30/22   Gerre Scull, NP  oxyCODONE-acetaminophen (PERCOCET/ROXICET) 5-325 MG tablet Take 1 tablet by mouth every 6 (six) hours as needed for severe pain. 01/21/23  Yes Smitty Knudsen, PA-C      Allergies    Peanut-containing drug products    Review of Systems   Review of Systems  Skin:  Positive for wound.  All other systems reviewed and are negative.   Physical Exam Updated Vital Signs BP  (!) 186/85 (BP Location: Left Arm)   Pulse 82   Temp 99.1 F (37.3 C) (Oral)   Resp 18   Ht 5' 1.5" (1.562 m)   Wt 86 kg   LMP 12/31/2022   SpO2 100%   BMI 35.24 kg/m  Physical Exam Vitals and nursing note reviewed.  HENT:     Head: Normocephalic and atraumatic.  Eyes:     General: No scleral icterus.       Right eye: No discharge.        Left eye: No discharge.  Cardiovascular:     Rate and Rhythm: Normal rate and regular rhythm.  Skin:    General: Skin is warm.     Findings: Lesion present. No rash.     Comments: Small area of a wound that is draining in the right axilla.  2 other areas of significant tenderness on examination but no obvious fluctuance noted.  There is some edema in this area but no obvious cyst or abscess located.  Ultrasound imaging also confirms no evidence of abscess formation at this site.     ED Results / Procedures / Treatments   Labs (all labs ordered are listed, but only abnormal results are displayed) Labs Reviewed - No data to display  EKG None  Radiology No results found.  Procedures Procedures   Medications Ordered in ED Medications - No data to display  ED Course/ Medical Decision Making/ A&P  Medical Decision Making Risk Prescription drug management.   This patient presents to the ED for concern of abscess.  Differential diagnosis includes hidradenitis suppurativa, cellulitis, folliculitis, pseudocyst  Problem List / ED Course:  Patient presents emergency department complaints of an abscess.  She was seen earlier today for similar concerns and diagnosed with hidradenitis suppurativa of the right axilla.  Condition is unchanged from earlier today.  Patient presented with hopes of having I&D performed in this area.  No obvious area of fluctuance noted on my examination.  Advised patient that she would benefit from outpatient follow-up with dermatology or general surgery for further treatment and  management of condition.  She denies having picked up doxycycline or tramadol that was prescribed earlier today.  Encourage patient to pick up doxycycline and take as prescribed.  No obvious area of cellulitis concerning for deeper infection.  Since there is an area of active drainage in the site, I&D not recommended.  Prescription of Percocet sent to patient's pharmacy.  Also provided patient with information for the hidradenitis suppurativa clinic in [place], [place].  Patient is agreeable to treatment plan and verbalized understanding all return precautions.   Final Clinical Impression(s) / ED Diagnoses Final diagnoses:  Hidradenitis suppurativa of right axilla    Rx / DC Orders ED Discharge Orders          Ordered    oxyCODONE-acetaminophen (PERCOCET/ROXICET) 5-325 MG tablet  Every 6 hours PRN        01/21/23 1936              Salomon Mast 01/21/23 1946    Mardene Sayer, MD 01/22/23 1207

## 2023-01-21 NOTE — Discharge Instructions (Signed)
Take the prescribed medication as directed.  Continue warm compresses to affected area. Follow-up with general surgery-- can call to set up appt. Return to the ED for new or worsening symptoms.

## 2023-01-21 NOTE — ED Triage Notes (Signed)
Pt c/o abscess on right axilla x 2 states was seen this morning and was prescribed antibiotics. Pain is worse

## 2023-01-21 NOTE — ED Provider Notes (Signed)
[place] EMERGENCY DEPARTMENT AT [facility] Provider Note   CSN: [id number] Arrival date & time: 01/20/23  2304     History  Chief Complaint  Patient presents with   Abscess    Alicia Cannon is a 42 y.o. female.  The history is provided by the patient and medical records.  Abscess  42 year old female presenting to the ED with abscess to right axilla.  States she has been told in the past she has hidradenitis suppurativa.  States currently has what she feels like 2 new abscesses in her right axilla, they have been draining a small amount of purulent material.  She denies any fever or chills.  She has been doing warm compresses at home but states pain is getting worse.  She has never been seen by surgery, but feels like it may be time to do this.  She has been taking Motrin at home which controlled her pain transiently but then recurs about 1 hour later.  Home Medications Prior to Admission medications   Medication Sig Start Date End Date Taking? Authorizing Provider  doxycycline (VIBRAMYCIN) 100 MG capsule Take 1 capsule (100 mg total) by mouth 2 (two) times daily. 01/21/23  Yes Garlon Hatchet, PA-C  ibuprofen (ADVIL) 800 MG tablet Take 800 mg by mouth every 8 (eight) hours as needed for mild pain.   Yes [provider]  traMADol (ULTRAM) 50 MG tablet Take 1 tablet (50 mg total) by mouth every 6 (six) hours as needed. 01/21/23  Yes Garlon Hatchet, PA-C  albuterol (PROVENTIL HFA;VENTOLIN HFA) 108 (90 Base) MCG/ACT inhaler Inhale 2 puffs into the lungs every 6 (six) hours as needed for wheezing or shortness of breath. Patient not taking: Reported on 12/13/2021 09/01/18   Overton Mam, DO  ibuprofen (ADVIL) 600 MG tablet Take 1 tablet (600 mg total) by mouth every 8 (eight) hours as needed. Patient not taking: Reported on 01/21/2023 04/30/22   Gerre Scull, NP      Allergies    Peanut-containing drug products    Review of Systems   Review of Systems   Skin:        abscess  All other systems reviewed and are negative.   Physical Exam Updated Vital Signs BP (!) 152/80   Pulse 99   Temp 99.2 F (37.3 C) (Oral)   Resp 16   LMP 12/31/2022   SpO2 99%   Physical Exam Vitals and nursing note reviewed.  Constitutional:      Appearance: She is well-developed.  HENT:     Head: Normocephalic and atraumatic.  Eyes:     Conjunctiva/sclera: Conjunctivae normal.     Pupils: Pupils are equal, round, and reactive to light.  Cardiovascular:     Rate and Rhythm: Normal rate and regular rhythm.     Heart sounds: Normal heart sounds.  Pulmonary:     Effort: Pulmonary effort is normal.     Breath sounds: Normal breath sounds.  Abdominal:     General: Bowel sounds are normal.     Palpations: Abdomen is soft.  Musculoskeletal:        General: Normal range of motion.     Cervical back: Normal range of motion.  Skin:    General: Skin is warm and dry.     Comments: Chronic scarring and inflammation in both axilla, right is worse than left, right axilla has 2-3 open areas that are draining purulent material , there is no warmth to touch or  cellulitic change present  Neurological:     Mental Status: She is alert and oriented to person, place, and time.     ED Results / Procedures / Treatments   Labs (all labs ordered are listed, but only abnormal results are displayed) Labs Reviewed - No data to display  EKG None  Radiology No results found.  Procedures Procedures    Medications Ordered in ED Medications  doxycycline (VIBRA-TABS) tablet 100 mg (100 mg Oral Given 01/21/23 0146)  traMADol (ULTRAM) tablet 50 mg (50 mg Oral Given 01/21/23 0146)    ED Course/ Medical Decision Making/ A&P                             Medical Decision Making Risk Prescription drug management.   42 y.o. F here with multiple abscesses of right axilla.  Known hx of hidradenitis suppurativa.  She is afebrile and nontoxic in appearance here.  She  does have scarring of the right axilla that is consistent with known history.  She does have 2-3 open areas in the right axilla that are freely draining purulent material.  There does not appear to be any evidence of cellulitis.  As areas are already open and draining, do not feel further I&D required at this time.  Will start on course of doxycycline.  She is interested in seeing surgeon to have this issue permanently addressed, she was given information for [facility] clinic.  She can return here for any new or acute changes.  Final Clinical Impression(s) / ED Diagnoses Final diagnoses:  Hidradenitis suppurativa of right axilla    Rx / DC Orders ED Discharge Orders          Ordered    doxycycline (VIBRAMYCIN) 100 MG capsule  2 times daily        01/21/23 0218    traMADol (ULTRAM) 50 MG tablet  Every 6 hours PRN        01/21/23 0218              Garlon Hatchet, PA-C 01/21/23 0328    Molpus, Jonny Ruiz, MD 01/21/23 [phone]

## 2023-03-30 IMAGING — US US PELVIS COMPLETE WITH TRANSVAGINAL
1 series · 13 of 25 positions shown · non-contrast
Comparison: 01/14/2019

CLINICAL DATA: Infertility evaluation, history of fibroids, LMP
12/01/2021, mild spotting 12/25/2021



[Series 1: us pelvis complete with transvaginal · 0.25mm/px · 13 of 79 slices shown]
[im 1/79]
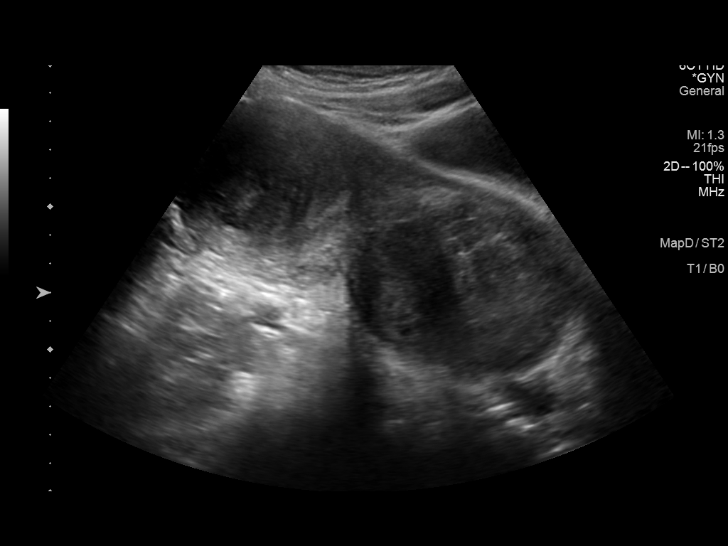
[im 7/79]
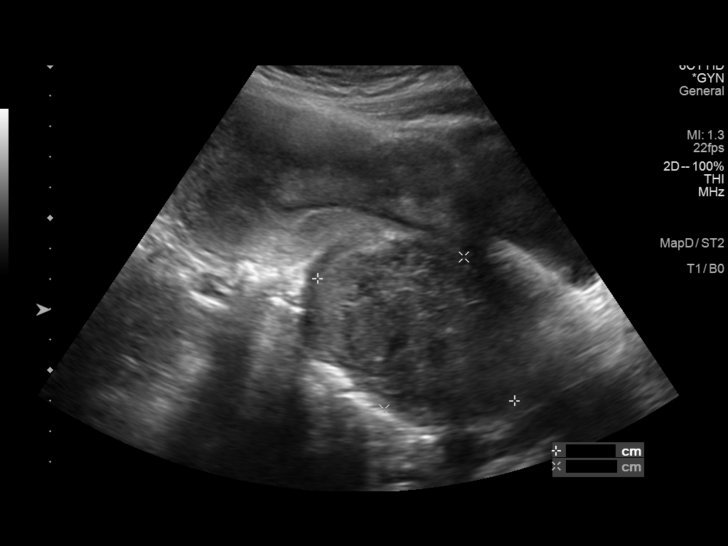
[im 14/79]
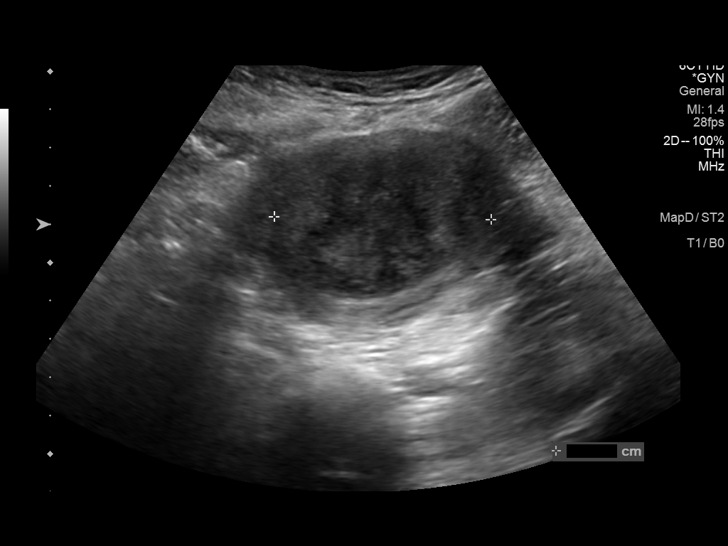
[im 20/79]
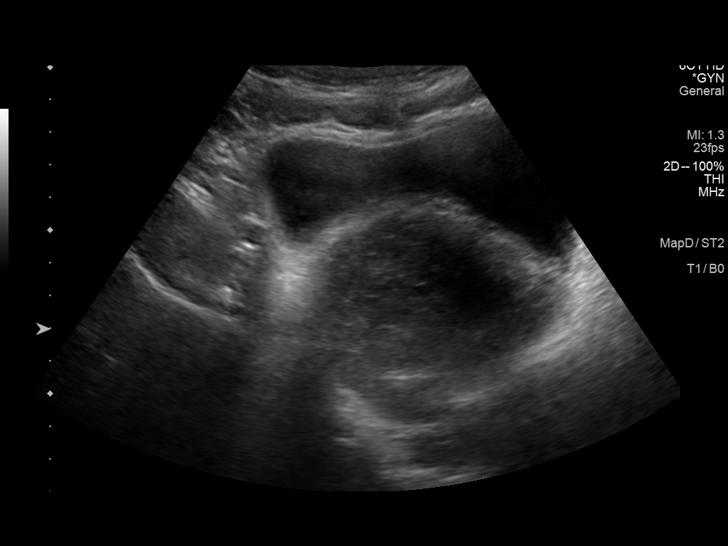
[im 27/79]
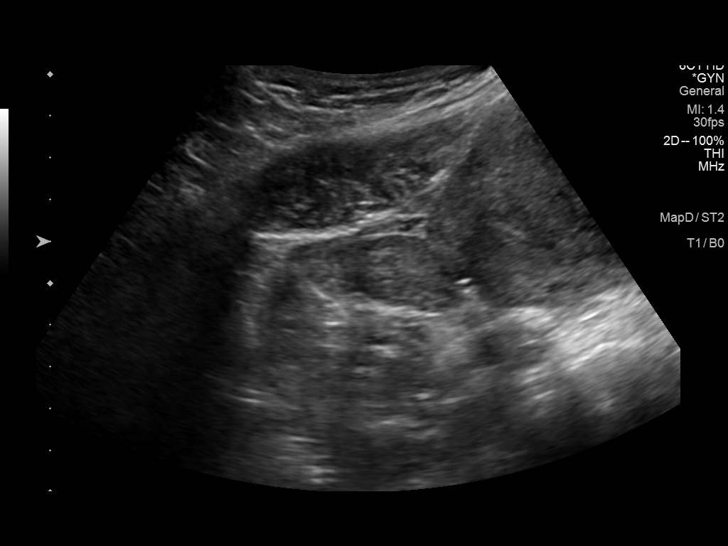
[im 33/79]
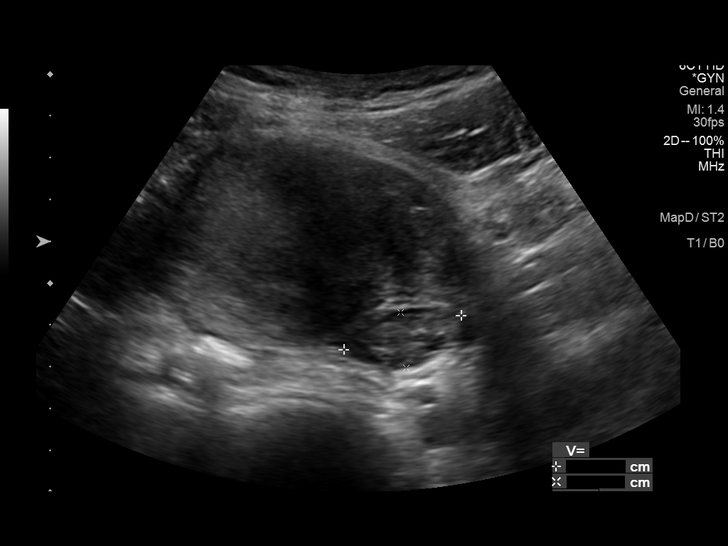
[im 40/79]
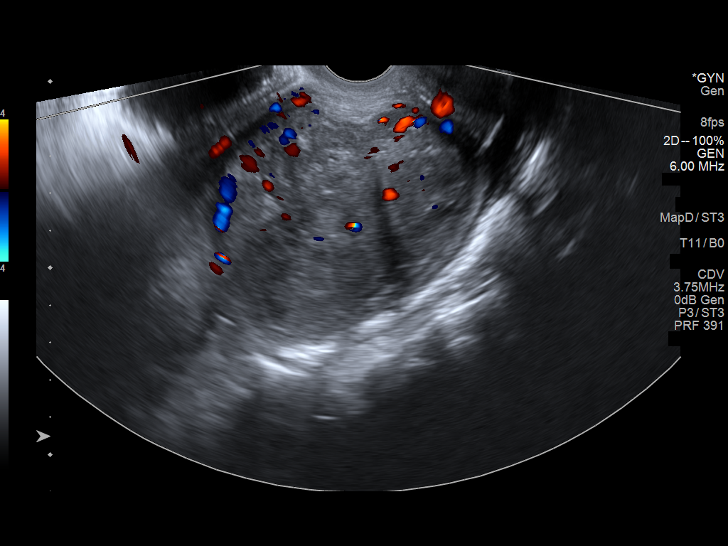
[im 46/79]
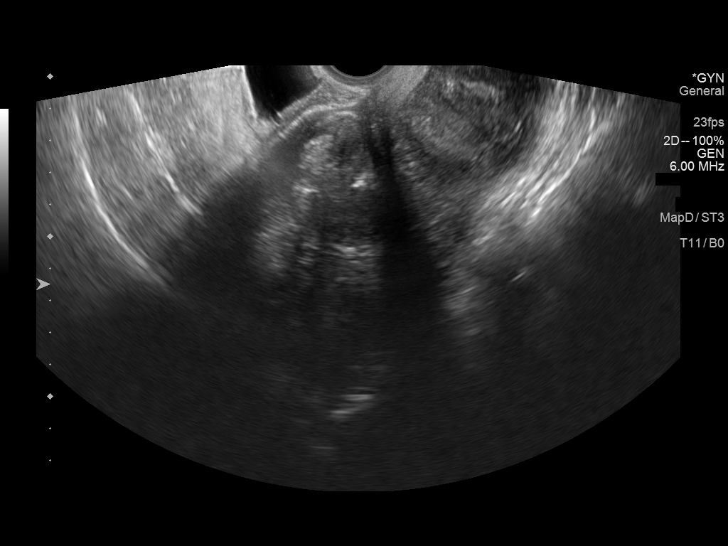
[im 53/79]
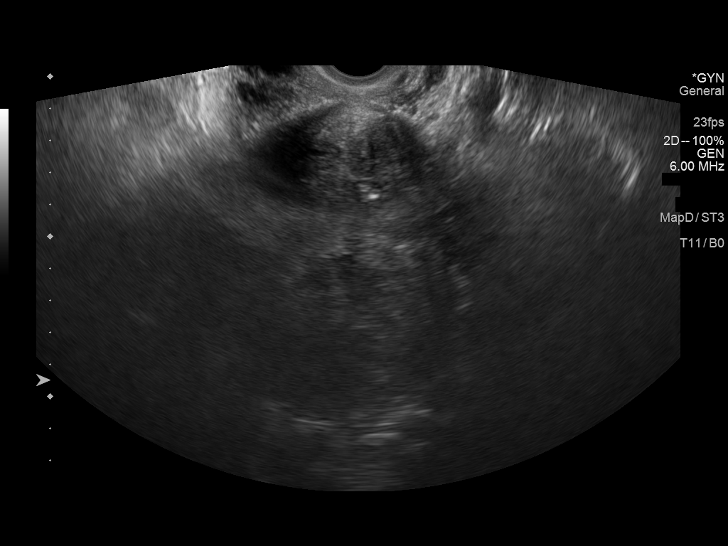
[im 59/79]
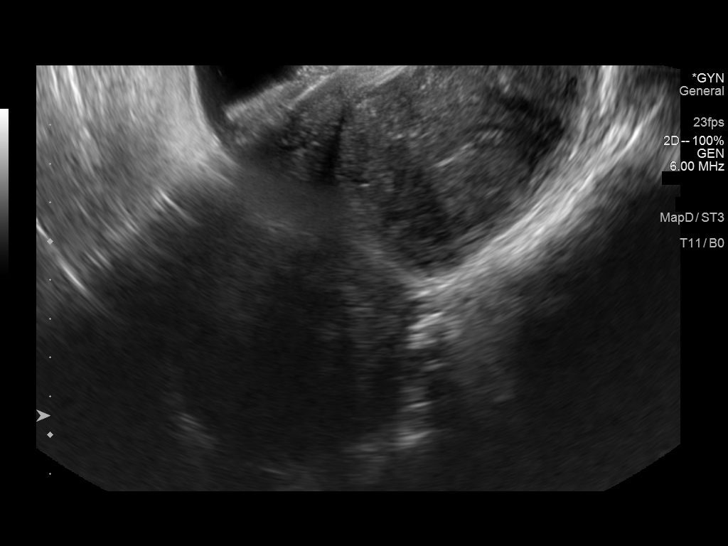
[im 66/79]
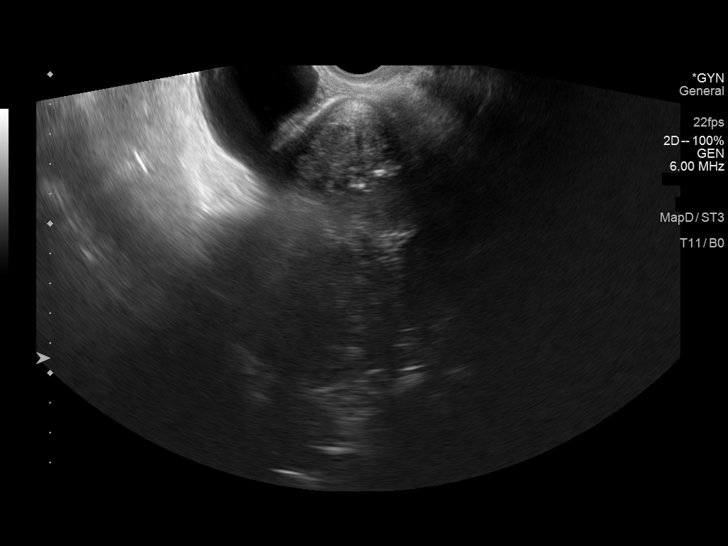
[im 72/79]
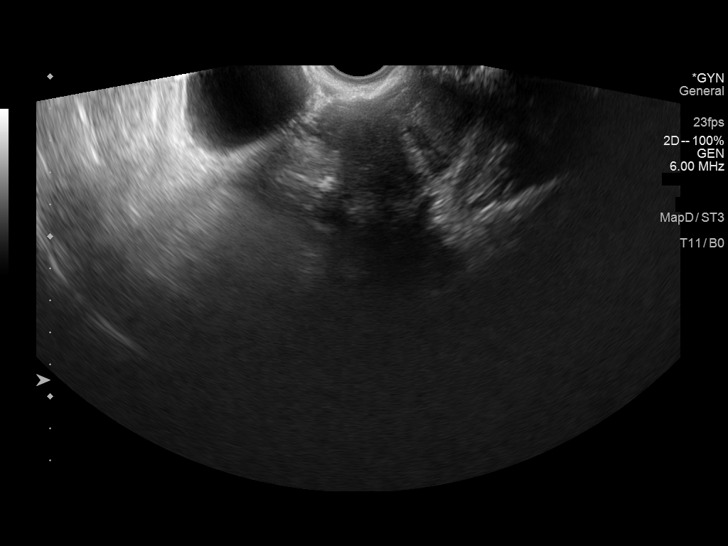
[im 79/79]
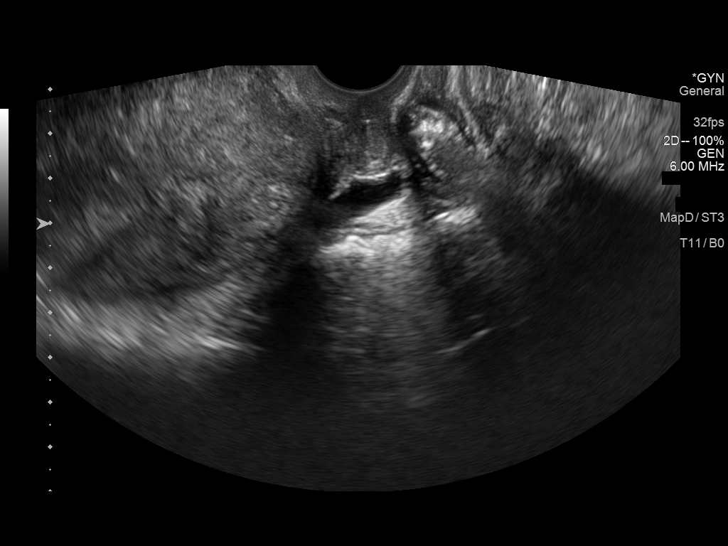

[13 of 25 positions shown; findings below may reference images not displayed]

FINDINGS: Uterus

Measurements: 14.8 x 6.3 x 7.7 cm = volume: 372 mL. Anteverted. At
least 3 leiomyomata are identified. Posterior leiomyoma exophytic
lower uterus 7.6 x 5.7 x 7.0 cm. Fundal leiomyoma 5.0 x 4.0 x
cm, likely extends submucosal. Additional subserosal leiomyoma
anterior upper uterus 2.4 x 1.9 x 2.4 cm.

Endometrium

Thickness: 7 mm.  No endometrial fluid or mass

Right ovary

Measurements: 4.0 x 2.0 x 3.5 cm = volume: 14.1 mL. Normal
morphology without mass

Left ovary

Measurements: 3.2 x 1.3 x 2.9 cm = volume: 6.6 mL. Normal morphology
without mass

Other findings

Trace free pelvic fluid.  No adnexal masses.
IMPRESSION: Uterus enlarged by multiple leiomyomata up to 7.6 cm greatest size.

At least 1 leiomyoma extends submucosal at upper uterus.

Remainder of exam unremarkable.

## 2023-09-02 ENCOUNTER — Ambulatory Visit: Payer: 59 | Admitting: Nurse Practitioner

## 2023-10-15 ENCOUNTER — Encounter: Payer: Self-pay | Admitting: Family Medicine

## 2023-11-30 DIAGNOSIS — Z8709 Personal history of other diseases of the respiratory system: Secondary | ICD-10-CM | POA: Diagnosis not present

## 2023-11-30 DIAGNOSIS — G629 Polyneuropathy, unspecified: Secondary | ICD-10-CM | POA: Diagnosis not present

## 2023-11-30 DIAGNOSIS — G4733 Obstructive sleep apnea (adult) (pediatric): Secondary | ICD-10-CM | POA: Diagnosis not present

## 2023-11-30 DIAGNOSIS — B009 Herpesviral infection, unspecified: Secondary | ICD-10-CM | POA: Diagnosis not present

## 2023-11-30 DIAGNOSIS — M2142 Flat foot [pes planus] (acquired), left foot: Secondary | ICD-10-CM | POA: Diagnosis not present

## 2023-11-30 DIAGNOSIS — L732 Hidradenitis suppurativa: Secondary | ICD-10-CM | POA: Diagnosis not present

## 2023-11-30 DIAGNOSIS — Z113 Encounter for screening for infections with a predominantly sexual mode of transmission: Secondary | ICD-10-CM | POA: Diagnosis not present

## 2023-11-30 DIAGNOSIS — M722 Plantar fascial fibromatosis: Secondary | ICD-10-CM | POA: Diagnosis not present

## 2023-11-30 DIAGNOSIS — M2141 Flat foot [pes planus] (acquired), right foot: Secondary | ICD-10-CM | POA: Diagnosis not present

## 2023-11-30 DIAGNOSIS — I10 Essential (primary) hypertension: Secondary | ICD-10-CM | POA: Diagnosis not present

## 2023-12-01 ENCOUNTER — Encounter (HOSPITAL_BASED_OUTPATIENT_CLINIC_OR_DEPARTMENT_OTHER): Payer: Self-pay | Admitting: Internal Medicine

## 2023-12-01 DIAGNOSIS — G4733 Obstructive sleep apnea (adult) (pediatric): Secondary | ICD-10-CM

## 2024-01-11 DIAGNOSIS — L732 Hidradenitis suppurativa: Secondary | ICD-10-CM | POA: Diagnosis not present

## 2024-02-08 DIAGNOSIS — M2142 Flat foot [pes planus] (acquired), left foot: Secondary | ICD-10-CM | POA: Diagnosis not present

## 2024-02-08 DIAGNOSIS — M2141 Flat foot [pes planus] (acquired), right foot: Secondary | ICD-10-CM | POA: Diagnosis not present

## 2024-02-08 DIAGNOSIS — D259 Leiomyoma of uterus, unspecified: Secondary | ICD-10-CM | POA: Diagnosis not present

## 2024-02-08 DIAGNOSIS — N979 Female infertility, unspecified: Secondary | ICD-10-CM | POA: Diagnosis not present

## 2024-02-08 DIAGNOSIS — G4733 Obstructive sleep apnea (adult) (pediatric): Secondary | ICD-10-CM | POA: Diagnosis not present

## 2024-02-08 DIAGNOSIS — M722 Plantar fascial fibromatosis: Secondary | ICD-10-CM | POA: Diagnosis not present

## 2024-02-08 DIAGNOSIS — I10 Essential (primary) hypertension: Secondary | ICD-10-CM | POA: Diagnosis not present

## 2024-02-08 DIAGNOSIS — Z113 Encounter for screening for infections with a predominantly sexual mode of transmission: Secondary | ICD-10-CM | POA: Diagnosis not present

## 2024-02-08 DIAGNOSIS — L732 Hidradenitis suppurativa: Secondary | ICD-10-CM | POA: Diagnosis not present

## 2024-02-22 ENCOUNTER — Ambulatory Visit (HOSPITAL_BASED_OUTPATIENT_CLINIC_OR_DEPARTMENT_OTHER): Attending: Family Medicine | Admitting: Internal Medicine

## 2024-02-22 DIAGNOSIS — G4733 Obstructive sleep apnea (adult) (pediatric): Secondary | ICD-10-CM | POA: Insufficient documentation

## 2024-02-22 NOTE — Progress Notes (Signed)
 02/22/24- 43 yoF

## 2024-02-27 NOTE — Procedures (Signed)
 [facility] [address] [place], [place] [address] Tel: [phone]   Fax: [phone]  Home Sleep Test Interpretation  Patient Name: Florean, Hoobler Date: 02/22/2024  Date of Birth: [birth year] Study Type: HST  Age: 43 year MRN #: [id number]  Sex: Female Interpreting Physician: NEYSA RAMA, [id number]  Height: 5' 1 Referring Physician: Kevin KANDICE Danker  Weight: 226.0 lbs Recording Tech: Will Poet RRT RPSGT RST  BMI: 43.2 Scoring Tech: Will Poet RRT RPSGT RST  ESS: 6 Neck Size: 14  %%start Indications for Polysomnography The patient is a 43 year old Female who is 5' 1 and weighs 226.0 lbs. Her BMI equals 43.2.  A home sleep apnea test was performed to evaluate for -.  Medication  No Data.   Polysomnogram Data A home sleep test recorded the standard physiologic parameters including EKG, nasal and oral airflow.  Respiratory parameters of chest and abdominal movements were recorded with Respiratory Inductance Plethysmography belts.  Oxygen saturation was recorded by pulse oximetry.   Study Architecture The total recording time of the polysomnogram was 359.3 minutes.  The total monitoring time was 360.0 minutes.  Time spent in Supine position was - minutes.   Respiratory Events The study revealed a presence of 5 obstructive, - central, and - mixed apneas resulting in an Apnea index of 0.8 events per hour.  There were 14 hypopneas (>=3% desaturation and/or arousal) resulting in an Apnea\Hypopnea Index (AHI >=3% desaturation and/or arousal) of 3.2 events per hour.  There were 12 hypopneas (>=4% desaturation) resulting in an Apnea\Hypopnea Index (AHI >=4% desaturation) of 2.8 events per hour.  There were - Respiratory Effort Related Arousals resulting in a RERA index of - events per hour. The Respiratory Disturbance Index is 3.2 events per hour.  The snore index was - events per hour.  Mean oxygen saturation was 95.3%.  The lowest  oxygen saturation during monitoring time was 47.0%.  Time spent <=88% oxygen saturation was 3.0 minutes (0.8%).  Cardiac Summary The average pulse rate was 72.6 bpm.  The minimum pulse rate was 56.0 bpm while the maximum pulse rate was 112.0 bpm.  Cardiac rhythm was normal  Comment: Occasional apneas and hypopneas, within normal limits, AHI (3%) 3.2/hr. Oxygen desaturation to a nadir of 47% with mean 95.3%. .  Diagnosis: Normal study  Recommendations: If results are inconsistent with clinical impression, consider repeat home study or in-lab study.   This study was personally reviewed and electronically signed by: Dr. RAMA JONETTA NEYSA Accredited Board Certified in Sleep Medicine Date/Time: 02/27/24  11:53    %%en%    Study Overview  Recording Time: 407.7 min. Monitoring Time: 360.0 min.  Analysis Start:  11:30:34 PM Supine Time: - min.  Analysis Stop:  05:29:52 AM     Study Summary   Count Index Longest Event Duration  Apneas & Hypopneas: 19 3.2  Apneas: 20.1 sec.     Hypopneas: 66.0 sec.  RERAs: - - - sec.  Desaturations: 15 2.5 67.8 sec.  Snores: - - - sec.    Minimum Oxygen Saturation: 47.0%    Respiratory Summary   Total Duration Supine Non-Supine   Count Index Average Longest Count Index Count Index  Obstructive Apnea 5 0.8 16.5 20.1 - - 5 0.8   Mixed Apnea - - - - - - - -   Central Apnea - - - - - - - -   Total Apneas 5 0.8 16.5 20.1 - - 5 0.8  Hypopneas 3% 14 2.3 N.A. N.A. - - 14 2.3   Apneas & Hyp. 3% 19 3.2 N.A. N.A. - - 19 3.2            Hypopneas 4% 12 2.0 N.A. N.A. - - 12 2.0  Apneas & Hyp. 4% 17 2.8 N.A. N.A. - - 17 2.8             RERAs - - - - - - - -  RDI 19 3.2 N.A. N.A. - - 19 3.2   Oxygen Saturation Summary   Total Supine Non-Supine  Average SpO2 95.3% - 95.3%  Minimum SpO2 47.0% - 47.0%   Maximum SpO2 100.0% - 100.0%   Oxygen Saturation Distribution  Range (%) Time in range (min) Time in range (%)  90.0 - 100.0 354.2 98.9%   80.0 - 90.0 2.9 0.8%  70.0 - 80.0 0.5 0.1%  60.0 - 70.0 0.1 0.0%  50.0 - 60.0 0.5 0.1%  0.0 - 50.0 0.2 0.0%  Time Spent <=88% SpO2  Range (%) Time in range (min) Time in range (%)  0.0 - 88.0 3.0 0.8%  Cardiac Summary   Total Supine Non-Supine  Average Pulse Rate (BPM) 72.6 - 72.6  Minimum Pulse Rate (BPM) 56.0 - 56.0  Maximum Pulse Rate (BPM) 112.0 - 112.0                      Technologist Comments  -                        Reggy Neysa Bateman, Biomedical engineer of Sleep Medicine  ELECTRONICALLY SIGNED ON:  02/27/2024, 11:45 AM [facility] PH: [phone]   FX: [phone] ACCREDITED BY THE AMERICAN ACADEMY OF SLEEP MEDICINE

## 2024-03-08 NOTE — Procedures (Signed)
 Orders only

## 2024-03-22 ENCOUNTER — Encounter: Payer: Self-pay | Admitting: Obstetrics and Gynecology

## 2024-03-22 ENCOUNTER — Ambulatory Visit: Payer: Self-pay | Admitting: Obstetrics and Gynecology

## 2024-03-22 VITALS — BP 130/70 | HR 80 | Ht 61.61 in | Wt 228.6 lb

## 2024-03-22 DIAGNOSIS — D219 Benign neoplasm of connective and other soft tissue, unspecified: Secondary | ICD-10-CM | POA: Diagnosis not present

## 2024-03-22 DIAGNOSIS — Z01419 Encounter for gynecological examination (general) (routine) without abnormal findings: Secondary | ICD-10-CM

## 2024-03-22 DIAGNOSIS — Z23 Encounter for immunization: Secondary | ICD-10-CM | POA: Diagnosis not present

## 2024-03-22 DIAGNOSIS — E876 Hypokalemia: Secondary | ICD-10-CM | POA: Diagnosis not present

## 2024-03-22 DIAGNOSIS — N838 Other noninflammatory disorders of ovary, fallopian tube and broad ligament: Secondary | ICD-10-CM | POA: Diagnosis not present

## 2024-03-22 DIAGNOSIS — Z6841 Body Mass Index (BMI) 40.0 and over, adult: Secondary | ICD-10-CM | POA: Diagnosis not present

## 2024-03-22 DIAGNOSIS — E559 Vitamin D deficiency, unspecified: Secondary | ICD-10-CM | POA: Diagnosis not present

## 2024-03-22 DIAGNOSIS — E6609 Other obesity due to excess calories: Secondary | ICD-10-CM | POA: Diagnosis not present

## 2024-03-22 DIAGNOSIS — N920 Excessive and frequent menstruation with regular cycle: Secondary | ICD-10-CM | POA: Diagnosis not present

## 2024-03-22 LAB — CBC
HCT: 36.5 % (ref 35.0–45.0)
Hemoglobin: 11.6 g/dL — ABNORMAL LOW (ref 11.7–15.5)
MCH: 26 pg — ABNORMAL LOW (ref 27.0–33.0)
MCHC: 31.8 g/dL — ABNORMAL LOW (ref 32.0–36.0)
MCV: 81.8 fL (ref 80.0–100.0)
MPV: 10.7 fL (ref 7.5–12.5)
Platelets: 211 Thousand/uL (ref 140–400)
RBC: 4.46 Million/uL (ref 3.80–5.10)
RDW: 14.5 % (ref 11.0–15.0)
WBC: 5.1 Thousand/uL (ref 3.8–10.8)

## 2024-03-22 LAB — TSH: TSH: 1.09 m[IU]/L

## 2024-03-22 LAB — FOLLICLE STIMULATING HORMONE: FSH: 11.9 m[IU]/mL

## 2024-03-22 LAB — VITAMIN D 25 HYDROXY (VIT D DEFICIENCY, FRACTURES): Vit D, 25-Hydroxy: 12 ng/mL — ABNORMAL LOW (ref 30–100)

## 2024-03-22 NOTE — Patient Instructions (Signed)
  [facility] [address] [place] and other locations [phone]
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
## 2024-03-23 ENCOUNTER — Ambulatory Visit: Payer: Self-pay | Admitting: Obstetrics and Gynecology

## 2024-03-23 LAB — HEMOGLOBIN A1C
Hgb A1c MFr Bld: 5.5 % (ref <5.7)
Mean Plasma Glucose: 111 mg/dL
eAG (mmol/L): 6.2 mmol/L

## 2024-03-23 LAB — IRON,TIBC AND FERRITIN PANEL
%SAT: 13 % — ABNORMAL LOW (ref 16–45)
Ferritin: 8 ng/mL — ABNORMAL LOW (ref 16–232)
Iron: 51 ug/dL (ref 40–190)
TIBC: 391 ug/dL (ref 250–450)

## 2024-03-25 LAB — ANTI-MULLERIAN HORMONE (AMH), FEMALE: Anti-Mullerian Hormones(AMH), Female: 3.34 ng/mL — ABNORMAL HIGH (ref 0.01–2.99)

## 2024-03-28 ENCOUNTER — Ambulatory Visit (HOSPITAL_COMMUNITY)
Admission: RE | Admit: 2024-03-28 | Discharge: 2024-03-28 | Disposition: A | Discharge status: Home / Self Care | Source: Ambulatory Visit | Attending: Obstetrics and Gynecology | Admitting: Obstetrics and Gynecology

## 2024-03-28 DIAGNOSIS — D251 Intramural leiomyoma of uterus: Secondary | ICD-10-CM | POA: Diagnosis not present

## 2024-03-28 DIAGNOSIS — D219 Benign neoplasm of connective and other soft tissue, unspecified: Secondary | ICD-10-CM | POA: Diagnosis not present

## 2024-03-28 DIAGNOSIS — N852 Hypertrophy of uterus: Secondary | ICD-10-CM | POA: Diagnosis not present

## 2024-03-28 MED ORDER — GADOBUTROL 1 MMOL/ML IV SOLN
10.0000 mL | Freq: Once | INTRAVENOUS | Status: AC | PRN
  Administered 2024-03-28: 10 mL via INTRAVENOUS

## 2024-03-29 DIAGNOSIS — Z113 Encounter for screening for infections with a predominantly sexual mode of transmission: Secondary | ICD-10-CM | POA: Diagnosis not present

## 2024-03-29 DIAGNOSIS — Z136 Encounter for screening for cardiovascular disorders: Secondary | ICD-10-CM | POA: Diagnosis not present

## 2024-03-29 DIAGNOSIS — Z1329 Encounter for screening for other suspected endocrine disorder: Secondary | ICD-10-CM | POA: Diagnosis not present

## 2024-03-29 DIAGNOSIS — I10 Essential (primary) hypertension: Secondary | ICD-10-CM | POA: Diagnosis not present

## 2024-03-29 DIAGNOSIS — Z131 Encounter for screening for diabetes mellitus: Secondary | ICD-10-CM | POA: Diagnosis not present

## 2024-04-07 DIAGNOSIS — G4733 Obstructive sleep apnea (adult) (pediatric): Secondary | ICD-10-CM | POA: Diagnosis not present

## 2024-04-07 DIAGNOSIS — N979 Female infertility, unspecified: Secondary | ICD-10-CM | POA: Diagnosis not present

## 2024-04-07 DIAGNOSIS — Z23 Encounter for immunization: Secondary | ICD-10-CM | POA: Diagnosis not present

## 2024-04-07 DIAGNOSIS — M2141 Flat foot [pes planus] (acquired), right foot: Secondary | ICD-10-CM | POA: Diagnosis not present

## 2024-04-07 DIAGNOSIS — L732 Hidradenitis suppurativa: Secondary | ICD-10-CM | POA: Diagnosis not present

## 2024-04-07 DIAGNOSIS — M722 Plantar fascial fibromatosis: Secondary | ICD-10-CM | POA: Diagnosis not present

## 2024-04-07 DIAGNOSIS — D649 Anemia, unspecified: Secondary | ICD-10-CM | POA: Diagnosis not present

## 2024-04-07 DIAGNOSIS — M2142 Flat foot [pes planus] (acquired), left foot: Secondary | ICD-10-CM | POA: Diagnosis not present

## 2024-04-07 DIAGNOSIS — D259 Leiomyoma of uterus, unspecified: Secondary | ICD-10-CM | POA: Diagnosis not present

## 2024-04-07 DIAGNOSIS — Z113 Encounter for screening for infections with a predominantly sexual mode of transmission: Secondary | ICD-10-CM | POA: Diagnosis not present

## 2024-04-07 DIAGNOSIS — I10 Essential (primary) hypertension: Secondary | ICD-10-CM | POA: Diagnosis not present

## 2024-04-11 ENCOUNTER — Other Ambulatory Visit: Payer: Self-pay | Admitting: Family Medicine

## 2024-04-11 DIAGNOSIS — Z1231 Encounter for screening mammogram for malignant neoplasm of breast: Secondary | ICD-10-CM

## 2024-04-19 ENCOUNTER — Ambulatory Visit
Admission: RE | Admit: 2024-04-19 | Discharge: 2024-04-19 | Disposition: A | Discharge status: Home / Self Care | Source: Ambulatory Visit | Attending: Family Medicine | Admitting: Family Medicine

## 2024-04-19 DIAGNOSIS — Z1231 Encounter for screening mammogram for malignant neoplasm of breast: Secondary | ICD-10-CM | POA: Diagnosis not present

## 2024-04-22 ENCOUNTER — Other Ambulatory Visit: Payer: Self-pay | Admitting: Family Medicine

## 2024-04-22 DIAGNOSIS — R928 Other abnormal and inconclusive findings on diagnostic imaging of breast: Secondary | ICD-10-CM

## 2024-05-04 ENCOUNTER — Other Ambulatory Visit: Payer: Self-pay | Admitting: Family Medicine

## 2024-05-04 ENCOUNTER — Ambulatory Visit
Admission: RE | Admit: 2024-05-04 | Discharge: 2024-05-04 | Disposition: A | Discharge status: Home / Self Care | Source: Ambulatory Visit | Attending: Family Medicine | Admitting: Family Medicine

## 2024-05-04 ENCOUNTER — Ambulatory Visit
Admission: RE | Admit: 2024-05-04 | Discharge: 2024-05-04 | Disposition: A | Source: Ambulatory Visit | Attending: Family Medicine | Admitting: Family Medicine

## 2024-05-04 DIAGNOSIS — R928 Other abnormal and inconclusive findings on diagnostic imaging of breast: Secondary | ICD-10-CM

## 2024-05-04 DIAGNOSIS — N6489 Other specified disorders of breast: Secondary | ICD-10-CM | POA: Diagnosis not present

## 2024-05-23 ENCOUNTER — Ambulatory Visit (INDEPENDENT_AMBULATORY_CARE_PROVIDER_SITE_OTHER)

## 2024-05-23 DIAGNOSIS — Z23 Encounter for immunization: Secondary | ICD-10-CM | POA: Diagnosis not present

## 2024-05-23 NOTE — Progress Notes (Signed)
 05/23/24: 2nd Gardasill shot given in the right deltoid (IM) by IC, CCMA

## 2024-06-13 DIAGNOSIS — G4733 Obstructive sleep apnea (adult) (pediatric): Secondary | ICD-10-CM | POA: Diagnosis not present

## 2024-06-13 DIAGNOSIS — M2141 Flat foot [pes planus] (acquired), right foot: Secondary | ICD-10-CM | POA: Diagnosis not present

## 2024-06-13 DIAGNOSIS — Z23 Encounter for immunization: Secondary | ICD-10-CM | POA: Diagnosis not present

## 2024-06-13 DIAGNOSIS — L732 Hidradenitis suppurativa: Secondary | ICD-10-CM | POA: Diagnosis not present

## 2024-06-13 DIAGNOSIS — E7849 Other hyperlipidemia: Secondary | ICD-10-CM | POA: Diagnosis not present

## 2024-06-13 DIAGNOSIS — I10 Essential (primary) hypertension: Secondary | ICD-10-CM | POA: Diagnosis not present

## 2024-06-13 DIAGNOSIS — Z0001 Encounter for general adult medical examination with abnormal findings: Secondary | ICD-10-CM | POA: Diagnosis not present

## 2024-06-13 DIAGNOSIS — Z1231 Encounter for screening mammogram for malignant neoplasm of breast: Secondary | ICD-10-CM | POA: Diagnosis not present

## 2024-06-13 DIAGNOSIS — M722 Plantar fascial fibromatosis: Secondary | ICD-10-CM | POA: Diagnosis not present

## 2024-06-13 DIAGNOSIS — Z124 Encounter for screening for malignant neoplasm of cervix: Secondary | ICD-10-CM | POA: Diagnosis not present

## 2024-06-13 DIAGNOSIS — N979 Female infertility, unspecified: Secondary | ICD-10-CM | POA: Diagnosis not present

## 2024-06-13 DIAGNOSIS — M2142 Flat foot [pes planus] (acquired), left foot: Secondary | ICD-10-CM | POA: Diagnosis not present

## 2024-08-09 ENCOUNTER — Encounter: Payer: Self-pay | Admitting: Obstetrics and Gynecology

## 2024-08-09 ENCOUNTER — Other Ambulatory Visit (HOSPITAL_COMMUNITY)
Admission: RE | Admit: 2024-08-09 | Discharge: 2024-08-09 | Disposition: A | Discharge status: Home / Self Care | Source: Ambulatory Visit | Attending: Obstetrics and Gynecology | Admitting: Obstetrics and Gynecology

## 2024-08-09 ENCOUNTER — Ambulatory Visit: Admitting: Obstetrics and Gynecology

## 2024-08-09 VITALS — BP 130/70 | HR 80 | Ht 62.0 in | Wt 236.0 lb

## 2024-08-09 DIAGNOSIS — N920 Excessive and frequent menstruation with regular cycle: Secondary | ICD-10-CM | POA: Insufficient documentation

## 2024-08-09 DIAGNOSIS — N921 Excessive and frequent menstruation with irregular cycle: Secondary | ICD-10-CM | POA: Diagnosis not present

## 2024-08-09 DIAGNOSIS — N898 Other specified noninflammatory disorders of vagina: Secondary | ICD-10-CM | POA: Diagnosis not present

## 2024-08-09 DIAGNOSIS — D219 Benign neoplasm of connective and other soft tissue, unspecified: Secondary | ICD-10-CM | POA: Diagnosis not present

## 2024-08-09 MED ORDER — MEDROXYPROGESTERONE ACETATE 10 MG PO TABS: 10.0000 mg | ORAL_TABLET | Freq: Every day | ORAL | 3 refills | Status: AC
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
## 2024-08-10 ENCOUNTER — Ambulatory Visit: Payer: Self-pay | Admitting: Obstetrics and Gynecology

## 2024-08-10 LAB — IRON,TIBC AND FERRITIN PANEL
%SAT: 9 % — ABNORMAL LOW (ref 16–45)
Ferritin: 7 ng/mL — ABNORMAL LOW (ref 16–232)
Iron: 37 ug/dL — ABNORMAL LOW (ref 40–190)
TIBC: 414 ug/dL (ref 250–450)

## 2024-08-10 LAB — CBC
HCT: 35.4 % — ABNORMAL LOW (ref 35.9–46.0)
Hemoglobin: 11.1 g/dL — ABNORMAL LOW (ref 11.7–15.5)
MCH: 24.7 pg — ABNORMAL LOW (ref 27.0–33.0)
MCHC: 31.4 g/dL — ABNORMAL LOW (ref 31.6–35.4)
MCV: 78.7 fL — ABNORMAL LOW (ref 81.4–101.7)
MPV: 11.1 fL (ref 7.5–12.5)
Platelets: 234 Thousand/uL (ref 140–400)
RBC: 4.5 Million/uL (ref 3.80–5.10)
RDW: 14.8 % (ref 11.0–15.0)
WBC: 6.2 Thousand/uL (ref 3.8–10.8)

## 2024-08-11 LAB — SURESWAB® ADVANCED VAGINITIS PLUS,TMA
C. trachomatis RNA, TMA: NOT DETECTED
CANDIDA SPECIES: DETECTED — AB
Candida glabrata: NOT DETECTED
N. gonorrhoeae RNA, TMA: NOT DETECTED
SURESWAB(R) ADV BACTERIAL VAGINOSIS(BV),TMA: POSITIVE — AB
TRICHOMONAS VAGINALIS (TV),TMA: NOT DETECTED

## 2024-08-11 LAB — SURGICAL PATHOLOGY

## 2024-08-11 MED ORDER — FLUCONAZOLE 150 MG PO TABS: ORAL_TABLET | ORAL | 3 refills | Status: DC

## 2024-08-11 MED ORDER — METRONIDAZOLE 500 MG PO TABS: 500.0000 mg | ORAL_TABLET | Freq: Two times a day (BID) | ORAL | 1 refills | Status: DC

## 2024-08-11 MED ORDER — MISOPROSTOL 200 MCG PO TABS: ORAL_TABLET | ORAL | 0 refills | Status: DC

## 2024-08-11 NOTE — Addendum Note (Signed)
 Addended by: VICCI ZADA PARAS on: 08/11/2024 01:01 PM   Modules accepted: Orders

## 2024-08-12 NOTE — Telephone Encounter (Signed)
 Patient was notified and I told her to check her mychart so she could read directions for the cytotec .

## 2024-08-25 ENCOUNTER — Ambulatory Visit
Admission: EM | Admit: 2024-08-25 | Discharge: 2024-08-25 | Disposition: A | Discharge status: Home / Self Care | Attending: Family Medicine | Admitting: Family Medicine

## 2024-08-25 DIAGNOSIS — R0789 Other chest pain: Secondary | ICD-10-CM

## 2024-08-25 DIAGNOSIS — J069 Acute upper respiratory infection, unspecified: Secondary | ICD-10-CM

## 2024-08-25 DIAGNOSIS — R112 Nausea with vomiting, unspecified: Secondary | ICD-10-CM

## 2024-08-25 MED ORDER — BENZONATATE 200 MG PO CAPS: 200.0000 mg | ORAL_CAPSULE | Freq: Three times a day (TID) | ORAL | 0 refills | Status: AC | PRN

## 2024-08-25 MED ORDER — ONDANSETRON 4 MG PO TBDP
4.0000 mg | ORAL_TABLET | Freq: Once | ORAL | Status: AC
  Administered 2024-08-25: 4 mg via ORAL

## 2024-08-25 MED ORDER — ONDANSETRON HCL 4 MG PO TABS: 4.0000 mg | ORAL_TABLET | Freq: Four times a day (QID) | ORAL | 0 refills | Status: AC

## 2024-08-25 MED ORDER — PREDNISONE 20 MG PO TABS: 40.0000 mg | ORAL_TABLET | Freq: Every day | ORAL | 0 refills | Status: AC
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
## 2024-08-25 NOTE — ED Triage Notes (Signed)
 Pt reports fever, cough, congestion, n/v/d x 4-5 days. Fever stopped 2-3 days ago. Reports new burning and sharpness in chest when coughing.  No meds today but has taken Mucinex and Ibuprofen  for symptoms.

## 2024-08-25 NOTE — Discharge Instructions (Addendum)
 You were seen today for upper respiratory symptoms, with nausea, vomiting and diarrhea.  I have given you medication today to help with the nausea, and sent this to your pharmacy as well.  I have sent out a medication for cough and a steroid.  You may use tylenol , and a heating pad or ice pack to your chest to help with discomfort.  I recommend you get plenty of rest and fluids.  If you are not improving or having worsening symptoms despite medications then please return or go to the ER for further evaluation.

## 2024-09-06 ENCOUNTER — Ambulatory Visit: Admitting: Obstetrics and Gynecology

## 2024-09-26 ENCOUNTER — Ambulatory Visit

## 2024-10-12 ENCOUNTER — Ambulatory Visit: Admitting: Obstetrics and Gynecology

## 2024-11-03 ENCOUNTER — Encounter
